# Patient Record
Sex: Female | Born: 1970 | Race: White | Hispanic: No | Marital: Married | Smoking: Never smoker
Health system: Southern US, Community
[De-identification: ages and names within clinical notes are randomized; demographics above are authoritative.]

## PROBLEM LIST (undated history)

## (undated) DIAGNOSIS — N809 Endometriosis, unspecified: Secondary | ICD-10-CM

## (undated) DIAGNOSIS — R519 Headache, unspecified: Secondary | ICD-10-CM

## (undated) DIAGNOSIS — F329 Major depressive disorder, single episode, unspecified: Secondary | ICD-10-CM

## (undated) DIAGNOSIS — R5381 Other malaise: Secondary | ICD-10-CM

## (undated) DIAGNOSIS — I10 Essential (primary) hypertension: Secondary | ICD-10-CM

## (undated) DIAGNOSIS — F411 Generalized anxiety disorder: Secondary | ICD-10-CM

## (undated) DIAGNOSIS — F419 Anxiety disorder, unspecified: Secondary | ICD-10-CM

## (undated) DIAGNOSIS — F32A Depression, unspecified: Secondary | ICD-10-CM

## (undated) DIAGNOSIS — G4700 Insomnia, unspecified: Secondary | ICD-10-CM

## (undated) DIAGNOSIS — F988 Other specified behavioral and emotional disorders with onset usually occurring in childhood and adolescence: Secondary | ICD-10-CM

## (undated) DIAGNOSIS — R5383 Other fatigue: Secondary | ICD-10-CM

## (undated) DIAGNOSIS — R51 Headache: Secondary | ICD-10-CM

## (undated) DIAGNOSIS — E663 Overweight: Secondary | ICD-10-CM

## (undated) HISTORY — PX: OOPHORECTOMY: SHX86

## (undated) HISTORY — PX: DILATION AND CURETTAGE OF UTERUS: SHX78

## (undated) HISTORY — PX: BREAST SURGERY: SHX581

## (undated) HISTORY — PX: PELVIC LAPAROSCOPY: SHX162

## (undated) HISTORY — DX: Overweight: E66.3

## (undated) HISTORY — PX: CHOLECYSTECTOMY: SHX55

## (undated) HISTORY — DX: Headache: R51

## (undated) HISTORY — DX: Other fatigue: R53.83

## (undated) HISTORY — DX: Other fatigue: R53.81

## (undated) HISTORY — PX: ABDOMINAL HYSTERECTOMY: SHX81

## (undated) HISTORY — DX: Other specified behavioral and emotional disorders with onset usually occurring in childhood and adolescence: F98.8

## (undated) HISTORY — DX: Anxiety disorder, unspecified: F41.9

## (undated) HISTORY — DX: Generalized anxiety disorder: F41.1

## (undated) HISTORY — DX: Headache, unspecified: R51.9

## (undated) HISTORY — DX: Insomnia, unspecified: G47.00

---
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
## 1997-09-20 ENCOUNTER — Other Ambulatory Visit: Admission: RE | Admit: 1997-09-20 | Discharge: 1997-09-20 | Payer: Self-pay | Admitting: *Deleted

## 1998-09-14 ENCOUNTER — Other Ambulatory Visit: Admission: RE | Admit: 1998-09-14 | Discharge: 1998-09-14 | Payer: Self-pay | Admitting: *Deleted

## 1999-09-08 ENCOUNTER — Other Ambulatory Visit: Admission: RE | Admit: 1999-09-08 | Discharge: 1999-09-08 | Payer: Self-pay | Admitting: Gynecology

## 2000-02-07 ENCOUNTER — Encounter: Admission: RE | Admit: 2000-02-07 | Discharge: 2000-05-07 | Payer: Self-pay | Admitting: Gynecology

## 2000-04-29 ENCOUNTER — Ambulatory Visit (HOSPITAL_COMMUNITY): Admission: RE | Admit: 2000-04-29 | Discharge: 2000-04-29 | Payer: Self-pay | Admitting: Gastroenterology

## 2000-04-29 ENCOUNTER — Encounter: Payer: Self-pay | Admitting: Gastroenterology

## 2000-06-20 ENCOUNTER — Encounter (INDEPENDENT_AMBULATORY_CARE_PROVIDER_SITE_OTHER): Payer: Self-pay | Admitting: Specialist

## 2000-06-20 ENCOUNTER — Observation Stay (HOSPITAL_COMMUNITY): Admission: RE | Admit: 2000-06-20 | Discharge: 2000-06-21 | Payer: Self-pay | Admitting: General Surgery

## 2000-06-20 ENCOUNTER — Encounter: Payer: Self-pay | Admitting: General Surgery

## 2000-10-17 ENCOUNTER — Other Ambulatory Visit: Admission: RE | Admit: 2000-10-17 | Discharge: 2000-10-17 | Payer: Self-pay | Admitting: Gynecology

## 2001-04-30 ENCOUNTER — Inpatient Hospital Stay (HOSPITAL_COMMUNITY): Admission: AD | Admit: 2001-04-30 | Discharge: 2001-05-03 | Payer: Self-pay | Admitting: Gynecology

## 2001-04-30 ENCOUNTER — Encounter (INDEPENDENT_AMBULATORY_CARE_PROVIDER_SITE_OTHER): Payer: Self-pay | Admitting: *Deleted

## 2001-06-06 ENCOUNTER — Other Ambulatory Visit: Admission: RE | Admit: 2001-06-06 | Discharge: 2001-06-06 | Payer: Self-pay | Admitting: Gynecology

## 2001-12-18 ENCOUNTER — Encounter: Admission: RE | Admit: 2001-12-18 | Discharge: 2002-03-18 | Payer: Self-pay | Admitting: Gynecology

## 2002-06-08 ENCOUNTER — Other Ambulatory Visit: Admission: RE | Admit: 2002-06-08 | Discharge: 2002-06-08 | Payer: Self-pay | Admitting: Gynecology

## 2003-07-13 ENCOUNTER — Other Ambulatory Visit: Admission: RE | Admit: 2003-07-13 | Discharge: 2003-07-13 | Payer: Self-pay | Admitting: Gynecology

## 2003-07-28 ENCOUNTER — Ambulatory Visit (HOSPITAL_COMMUNITY): Admission: RE | Admit: 2003-07-28 | Discharge: 2003-07-28 | Payer: Self-pay | Admitting: Gynecology

## 2003-07-28 ENCOUNTER — Encounter (INDEPENDENT_AMBULATORY_CARE_PROVIDER_SITE_OTHER): Payer: Self-pay | Admitting: Specialist

## 2003-10-07 ENCOUNTER — Other Ambulatory Visit: Admission: RE | Admit: 2003-10-07 | Discharge: 2003-10-07 | Payer: Self-pay | Admitting: Gynecology

## 2004-07-18 ENCOUNTER — Other Ambulatory Visit: Admission: RE | Admit: 2004-07-18 | Discharge: 2004-07-18 | Payer: Self-pay | Admitting: Gynecology

## 2004-11-29 ENCOUNTER — Ambulatory Visit: Payer: Self-pay | Admitting: Family Medicine

## 2007-12-22 ENCOUNTER — Other Ambulatory Visit: Admission: RE | Admit: 2007-12-22 | Discharge: 2007-12-22 | Payer: Self-pay | Admitting: Gynecology

## 2007-12-22 ENCOUNTER — Ambulatory Visit: Payer: Self-pay | Admitting: Gynecology

## 2008-01-05 ENCOUNTER — Encounter: Admission: RE | Admit: 2008-01-05 | Discharge: 2008-01-05 | Payer: Self-pay | Admitting: Gynecology

## 2010-05-19 NOTE — Op Note (Signed)
NAME:  Sally Jackson, Sally Jackson                             ACCOUNT NO.:  [id number]   MEDICAL RECORD NO.:  [id number]                   PATIENT TYPE:  AMB   LOCATION:  SDC                                  FACILITY:  [facility]   PHYSICIAN:  Timothy P. Fontaine, M.D.           DATE OF BIRTH:  1970-05-23   DATE OF PROCEDURE:  07/28/2003  DATE OF DISCHARGE:                                 OPERATIVE REPORT   PREOPERATIVE DIAGNOSIS:  Pelvic pain, rule out endometriosis.   POSTOPERATIVE DIAGNOSIS:  Pelvic pain, rule out endometriosis.   OPERATION PERFORMED:  Laparoscopic biopsy, fulguration of endometriosis.   SURGEON:  Timothy P. Fontaine, M.D.   ANESTHESIA:  General endotracheal.   COMPLICATIONS:  None.   ESTIMATED BLOOD LOSS:  Minimal.   SPECIMENS:  Biopsy peritoneal times two.   FINDINGS:  Anterior cul-de-sac with small midline brown endometriotic  superficial peritoneal lesion, the vesicouterine fold, fulgurated posterior  cul-de-sac, classic powder burn, endometriotic implant midright uterosacral  ligament on the lateral aspect, deep into ligament.  Lateral to this a  superficial powder burn peritoneal implant.  Both areas were biopsied and  bipolar fulgurated.  A white superficial perineal endometriotic implant  medial to the left upper uterosacral ligament, bipolar cauterized.  Right  and left fallopian tubes normal length, caliber, fimbriated ends.  Left  ovary grossly normal, free and mobile.  Right ovary with a small brown  superficial cortex lesion consistent with endometriosis.  Bipolar  cauterized.  Ovary otherwise normal, free and mobile.  Uterus normal, shape,  size and contour.  A bluish dome cystic area, upper anterior serosal area  questionable adenomyotic cyst versus postsurgical cystic change.  The area  was bipolar cauterized.  Upper abdominal exam was grossly normal.  No  evidence of adhesions.  Liver was smooth.  No abnormalities.  The appendix  was visualized, free and  mobile.   DESCRIPTION OF PROCEDURE:  The patient was taken to the operating room,  underwent general endotracheal anesthesia, was placed in low dorsal  lithotomy position, received an abdominal preparation with Betadine scrub,  subsequent preparation with Betadine solution.  She received a perineal  vaginal preparation with Betadine solution.  Bladder emptied with in and out  Foley catheterization.  Examination under anesthesia performed and a Hulka  tenaculum was placed through the cervix for manipulation.  The patient was  draped in the usual fashion.  A vertical infraumbilical incision was made,  the Verres needle placed, its position verified with water and approximately  3 L of carbon dioxide gas was infused without difficulty.  The 10 mm  laparoscopic trocar was then placed without difficulty.  Its position  verified visually.  A 5 mm suprapubic port was then placed under direct  visualization after transillumination for the vessels in the prior cesarean  scar without difficulty.  Examination of the pelvic organs and upper  abdominal exam was carried out with  the findings noted above.  The Hulka  tenaculum was manipulated to assure the bluish cystic anterior change was  not as a result of the Hulka tenaculum and it was not.  The right  uterosacral lesion was then elevated with a grasper and a biopsy was taken.  The right peritoneal lesion was elevated with a grasper to tent the lesion  away from the underlying structures and a representative biopsy again was  taken.  Subsequently bipolar cautery was delivered to both of these areas.  Again tenting the peritoneum to avoid underlying structural damage.  The  left posterior cul-de-sac, right endometriotic implant was then tented with  the bipolar grasper and bipolar cauterized.  A similar procedure was carried  out on the anterior vesicouterine fold endometriotic implant.  Lastly, the  uterine cystic change was manipulated with the  grasper and underlying  nodularity was noted consistent with a small fibroid versus adenomyotic  nodule.  This area was bipolar cauterized.  Lastly, the right ovarian  superficial cortex endometriotic area was bipolar cauterized. The pelvis was  then copiously irrigated.  Adequate hemostasis was visualized.  No other  areas of endometriosis were appreciated.  The suprapubic port was removed,  the gas allowed to escape, all areas inspected under a low pressure  situation showing adequate hemostasis and the infraumbilical port was then  backed out under direct visualization showing adequate hemostasis and no  evidence of hernia formation.  The 0 Vicryl subcutaneous fascial stitch was  placed infraumbilically.  Both skin incision sites were injected using 0.25%  Marcaine and both skin sites were closed using Dermabond skin adhesive.  The  Hulka tenaculum was removed.  The patient was placed in supine position and  awakened without difficulty, taken to recovery room in good condition having  tolerated the procedure well.                                               Timothy P. Audie Box, M.D.    TPF/MEDQ  D:  07/28/2003  T:  07/28/2003  Job:  [id number]

## 2010-05-19 NOTE — Op Note (Signed)
[facility]  Patient:    SIHAAM, CHROBAK Visit Number: [id number] MRN: [id number]          Service Type: OBS Location: 910A 9116 01 Attending Physician:  Merrily Pew Dictated by:   Nadyne Coombes Fontaine, M.D. Proc. Date: 04/30/01 Admit Date:  04/30/2001                             Operative Report  PREOPERATIVE DIAGNOSES:       1. Pregnancy at 38 weeks.                               2. Breech presentation.                               3. Marginal amniotic fluid index.  POSTOPERATIVE DIAGNOSES:      1. Pregnancy at 38 weeks.                               2. Breech presentation.                               3. Marginal amniotic fluid index.  PROCEDURE:                    Primary low transverse cervical cesarean section.  SURGEON:                      Timothy P. Fontaine, M.D.  ASSISTANT:                    Katy Fitch, M.D.  ANESTHESIA:                   Spinal.  ESTIMATED BLOOD LOSS:         Less than 500 cc.  COMPLICATIONS:                None.  SPECIMENS:                    Samples of cord blood and placenta.  FINDINGS:                     At 59, a normal female with Apgars of 9 and 9. Weight 7 pounds 7 ounces.  Pelvic anatomy noted to be normal.  DESCRIPTION OF PROCEDURE:     The patient was taken to the operating room and underwent spinal anesthesia.  She was placed in the left tilt supine position and received an abdominal preparation with Betadine scrub and Betadine solution.  A Foley catheter was placed with sterile technique.   The patient was draped in the usual fashion.  After assuring adequate anesthesia, the abdomen was sharply entered through a Pfannenstiel incision, achieving adequate hemostasis at all levels.  A bladder flap was then sharply and bluntly developed without difficulty.  The uterus was sharply entered in the lower uterine segment and bluntly extended laterally.  The membranes were ruptured and  the fluid noted to be clear.  The infant was found to be in the frank breech presentation, was converted to a footling breech and underwent a breech extraction.  The nares and mouth  were suctioned.  The cord was double clamped and cut and the infant was handed to pediatrics in attendance. Samples of cord blood were obtained.  The placenta was spontaneously extruded, noted to be intact and was sent to pathology.  The patient received 1 g of Ancef IV antibiotic prophylaxis.  The uterus was exteriorized.  The endometrial cavity was explored with a sponge to remove all placental membrane fragments.  The uterine incision was closed in one layer using 0 Vicryl suture with a running interlocking stitch.  Adequate hemostasis was achieved with each layer.  The uterus was returned to the abdomen.  The abdomen was copiously irrigated.  Adequate hemostasis was visualized.  The anterior fascia was then closed in a running stitch using 0 Vicryl suture.  The subcutaneous tissues were irrigated.  Adequate hemostasis was achieved with electrocautery. The skin was reapproximated with staples.  A sterile dressing was applied. The patient was taken to the recovery room in good condition, having tolerated the procedure well. Dictated by:   Nadyne Coombes. Fontaine, M.D. Attending Physician:  Merrily Pew DD:  04/30/01 TD:  04/30/01 Job: [id number]

## 2010-05-19 NOTE — Discharge Summary (Signed)
[facility]  Patient:    Sally Jackson, Sally Jackson Visit Number: [id number] MRN: [id number]          Service Type: OBS Location: 910A 9116 01 Attending Physician:  Merrily Pew Dictated by:   Antony Contras, [facility] Admit Date:  04/30/2001 Discharge Date: 05/03/2001                             Discharge Summary  DISCHARGE DIAGNOSES:          1. Intrauterine pregnancy at 38 weeks.                               2. Breech presentation.                               3. Marginal amniotic fluid.  PROCEDURES:                   Primary low cervical transverse cesarean section with delivery of a viable infant.  HISTORY OF PRESENT ILLNESS:   The patient is a 40 year old gravida 3, para 1-0-1-1 with an LMP of July 03, 2000, [facility] May 10, 2001.  Prenatal course was complicated by oligohydramnios, breech presentation.  The patients first child also has aortic stenosis.  LABORATORY DATA:              Blood type A+, antibody screen negative.  RPR, HBsAg, HIV nonreactive.  HOSPITAL COURSE AND TREATMENT:                    The patient was admitted at 38 weeks for a cesarean section secondary to breech presentation, marginal amniotic fluid index.  The procedure was performed by Dr. Audie Box, assisted by Dr. Penni Homans under spinal anesthesia.  Findings included a normal female infant, Apgars 9 and 9, weight 7 pounds and 7 ounces, normal pelvic anatomy.  Postpartum course, remained afebrile, had no difficulty voiding, was able to be discharged on her third postpartum day in satisfactory condition.  CBC:  Hematocrit 32, hemoglobin 11, WBC 10.2, platelets 171.  DISPOSITION:                  Follow up in six weeks.  Continue with prenatal vitamins and iron, Motrin and Tylox for pain. Dictated by:   Antony Contras, [facility] Attending Physician:  Merrily Pew DD:  05/22/01 TD:  05/25/01 Job: [id number]

## 2010-05-19 NOTE — H&P (Signed)
NAME:  Sally Jackson, Sally Jackson                             ACCOUNT NO.:  [id number]   MEDICAL RECORD NO.:  [id number]                   PATIENT TYPE:  AMB   LOCATION:  SDC                                  FACILITY:  [facility]   PHYSICIAN:  Timothy P. Fontaine, M.D.           DATE OF BIRTH:  1970/06/05   DATE OF ADMISSION:  07/28/2003  DATE OF DISCHARGE:                                HISTORY & PHYSICAL   CHIEF COMPLAINT:  Right-sided pain.   HISTORY OF PRESENT ILLNESS:  A 40 year old, G3, P2, AB1, female with a  several month history of right-sided pain.  The patient notes that the pain  is on a daily basis, sharp, stabbing, to cramping.  It is reproducible with  intercourse on this side with ever episode.  She has no bowel or urinary  symptoms.  Outpatient ultrasound is normal.  She is admitted at this time  for laparoscopy for her pelvic pain.   PAST MEDICAL HISTORY:  Heart palpitations.   PAST SURGICAL HISTORY:  1. Vaginal delivery x1.  2. Cesarean section x1.  3. Cholecystectomy.  4. D&C.   CURRENT MEDICATIONS:  1. __________ 20 mg daily.  2. Toprol-XL 50 mg daily.  3. Multivitamins.  4. Prevacid 30 mg daily.   ALLERGIES:  No medications.   REVIEW OF SYSTEMS:  Noncontributory.   FAMILY HISTORY:  Noncontributory.   SOCIAL HISTORY:  Noncontributory.   PHYSICAL EXAMINATION:  HEENT:  Normal.  LUNGS:  Clear.  CARDIAC:  Regular rate without rubs, murmurs, or gallops.  ABDOMEN:  Benign.  PELVIC:  External BUS, vagina normal.  Cervix normal.  Uterus normal size,  shape, contour, midline, mobile.  Adnexa without masses or tenderness.   ASSESSMENT:  A 40 year old, G3, P2, AB1 female with persistent right-sided  pain, worsening, reproducible with intercourse.  No palpable or  ultrasonographic abnormalities.  The patient is admitted at this time for  laser video laparoscopy.  I reviewed with the patient what is involved with  the procedure, the expected intraoperative, postoperative  courses.  She  understands there are no guarantees as far as pain relief.  Her pain may  persist, worsen, or change following the procedure.  She may also have a  negative laparoscopy, all of which she understands.  The risks of  inadvertent injury to internal organs including bowel, bladder, ureters,  vessels, and nerves necessitating major exploratory reparative surgeries and  future reparative surgeries including ostomy formation was all discussed,  understood, and accepted.  The risks of transfusion if hemorrhage occurs was  discussed with her, as well as the risk of infection, both internal  requiring prolonged antibiotics, as well as incision requiring opening and  draining of incisions was all discussed with her.  The patient's questions  were answered to her satisfaction, and she is ready to proceed with surgery.  Timothy P. Audie Box, M.D.    TPF/MEDQ  D:  07/27/2003  T:  07/27/2003  Job:  [id number]

## 2010-05-19 NOTE — H&P (Signed)
[facility]  Patient:    Sally Jackson, Sally Jackson Visit Number: [id number] MRN: [id number]          Service Type: Attending:  Nadyne Coombes. Fontaine, M.D. Dictated by:   Nadyne Coombes. Fontaine, M.D. Adm. Date:  04/24/01                           History and Physical  CHIEF COMPLAINT:              1. Pregnancy at 37+ weeks.                               2. Persistent breech presentation.                               3. Marginal alpha fetoprotein.  HISTORY OF PRESENT ILLNESS:   This 40 year old G3, P1, AB1 female at [redacted] weeks gestation has been followed for persistent breech presentation and marginal AFI measurements.  The patient has been having preterm contractions which have resulted in cervical dilatation to 1-2 cm dilated, 50% effaced and -2 station, and has been treated with at home bed rest.  The patient was noted on antepartum evaluation to have an AFI in the 10th percentile and has been followed with home bed rest, with most recent measurement in the 15th percentile.  The patients placenta is also noted to be anterior on ultrasonographic evaluation.  The options for expectant management with attempted version version versus vaginal breech delivery versus primary cesarean section were reviewed with her, and she elects for cesarean section. Due to the cervical change and the frequent episodes of contractions as well as her marginal AFI, the patient has been scheduled earlier than an elective cesarean section at 37-38 weeks.  The patient understands the issues of prematurity associated with delivery.  PAST MEDICAL HISTORY:         Uncomplicated.  PAST SURGICAL HISTORY:        1. D&C.                               2. Cholecystectomy.                               3. Wisdom teeth.                               4. Breast cyst removal.  CURRENT MEDICATIONS:          Prenatal vitamins.  ALLERGIES:                    None.  REVIEW OF SYSTEMS:             Noncontributory.  FAMILY HISTORY:               Noncontributory.  SOCIAL HISTORY:               Noncontributory.  ADMISSION PHYSICAL EXAMINATION:  VITAL SIGNS:                  Afebrile.  Vital signs are stable.  HEENT:  Normal.  LUNGS:                        Clear.  CARDIAC:                      Regular rate.  No murmurs, rubs, or gallops.  ABDOMEN:                      Breech presentation.  PELVIC:                       Deferred.  Positive fetal heart tones.  ASSESSMENT:                   32. A 40 year old G3, P1, AB1 female at 37-[redacted]                                  weeks gestation.                               2. Persistent breech presentation.                               3. Anterior placenta.                               4. Marginal alpha fetoprotein measurements.  PLAN:                         Primary cesarean section.  The risks, benefits, indications and alternatives were reviewed with the patient to include attempts at version versus vaginal breech delivery and she elects for primary cesarean section.  I reviewed the risks associated with this to include bleeding; transfusion; infection; wound complications requiring opening and draining of incisions; closure by secondary intention; damage to internal organs including bowel, bladder, ureters, vessels and nerves necessitating major exploratory surgeries and future reparative surgeries including ostomy formation.  Fetal injury during the process was also reviewed, understood and accepted.  The patients questions were answered to her satisfaction and she is ready to proceed with surgery. Dictated by:   Nadyne Coombes. Fontaine, M.D. Attending:  Nadyne Coombes. Fontaine, M.D. DD:  04/21/01 TD:  04/21/01 Job: [id number]

## 2012-08-12 ENCOUNTER — Other Ambulatory Visit: Payer: Self-pay

## 2012-08-12 DIAGNOSIS — Z1231 Encounter for screening mammogram for malignant neoplasm of breast: Secondary | ICD-10-CM

## 2012-09-15 ENCOUNTER — Ambulatory Visit: Payer: Self-pay

## 2012-10-20 ENCOUNTER — Ambulatory Visit: Payer: Self-pay

## 2012-12-22 ENCOUNTER — Ambulatory Visit
Admission: RE | Admit: 2012-12-22 | Discharge: 2012-12-22 | Disposition: A | Discharge status: Home / Self Care | Payer: BC Managed Care – PPO | Source: Ambulatory Visit

## 2012-12-22 DIAGNOSIS — Z1231 Encounter for screening mammogram for malignant neoplasm of breast: Secondary | ICD-10-CM

## 2016-05-24 ENCOUNTER — Other Ambulatory Visit: Payer: Self-pay

## 2016-05-24 ENCOUNTER — Emergency Department (HOSPITAL_COMMUNITY): Payer: Commercial Managed Care - PPO

## 2016-05-24 ENCOUNTER — Encounter (HOSPITAL_COMMUNITY): Payer: Self-pay | Admitting: Emergency Medicine

## 2016-05-24 ENCOUNTER — Emergency Department (HOSPITAL_COMMUNITY)
Admission: EM | Admit: 2016-05-24 | Discharge: 2016-05-25 | Disposition: A | Discharge status: Home / Self Care | Payer: Commercial Managed Care - PPO | Attending: Emergency Medicine | Admitting: Emergency Medicine

## 2016-05-24 DIAGNOSIS — R0602 Shortness of breath: Secondary | ICD-10-CM | POA: Diagnosis not present

## 2016-05-24 DIAGNOSIS — I10 Essential (primary) hypertension: Secondary | ICD-10-CM | POA: Insufficient documentation

## 2016-05-24 DIAGNOSIS — R079 Chest pain, unspecified: Secondary | ICD-10-CM

## 2016-05-24 DIAGNOSIS — R0789 Other chest pain: Secondary | ICD-10-CM | POA: Diagnosis not present

## 2016-05-24 DIAGNOSIS — E876 Hypokalemia: Secondary | ICD-10-CM | POA: Diagnosis not present

## 2016-05-24 HISTORY — DX: Essential (primary) hypertension: I10

## 2016-05-24 HISTORY — DX: Major depressive disorder, single episode, unspecified: F32.9

## 2016-05-24 HISTORY — DX: Depression, unspecified: F32.A

## 2016-05-24 HISTORY — DX: Endometriosis, unspecified: N80.9

## 2016-05-24 LAB — BASIC METABOLIC PANEL
ANION GAP: 8 (ref 5–15)
BUN: 9 mg/dL (ref 6–20)
CO2: 27 mmol/L (ref 22–32)
Calcium: 9.2 mg/dL (ref 8.9–10.3)
Chloride: 104 mmol/L (ref 101–111)
Creatinine, Ser: 0.74 mg/dL (ref 0.44–1.00)
Glucose, Bld: 106 mg/dL — ABNORMAL HIGH (ref 65–99)
Potassium: 2.9 mmol/L — ABNORMAL LOW (ref 3.5–5.1)
Sodium: 139 mmol/L (ref 135–145)

## 2016-05-24 LAB — I-STAT TROPONIN, ED: TROPONIN I, POC: 0 ng/mL (ref 0.00–0.08)

## 2016-05-24 LAB — CBC
HCT: 38 % (ref 36.0–46.0)
HEMOGLOBIN: 12.3 g/dL (ref 12.0–15.0)
MCH: 28.7 pg (ref 26.0–34.0)
MCHC: 32.4 g/dL (ref 30.0–36.0)
MCV: 88.8 fL (ref 78.0–100.0)
Platelets: 275 10*3/uL (ref 150–400)
RBC: 4.28 MIL/uL (ref 3.87–5.11)
RDW: 12.7 % (ref 11.5–15.5)
WBC: 9 10*3/uL (ref 4.0–10.5)

## 2016-05-24 LAB — D-DIMER, QUANTITATIVE (NOT AT ARMC): D DIMER QUANT: 0.42 ug{FEU}/mL (ref 0.00–0.50)

## 2016-05-24 MED ORDER — POTASSIUM CHLORIDE CRYS ER 20 MEQ PO TBCR
60.0000 meq | EXTENDED_RELEASE_TABLET | Freq: Once | ORAL | Status: AC
  Administered 2016-05-25: 60 meq via ORAL
  Filled 2016-05-24: qty 3

## 2016-05-24 MED ORDER — POTASSIUM CHLORIDE CRYS ER 20 MEQ PO TBCR: 40.0000 meq | EXTENDED_RELEASE_TABLET | Freq: Once | ORAL | Status: DC

## 2016-05-24 NOTE — ED Provider Notes (Signed)
[facility]-EMERGENCY DEPT Provider Note   CSN: [id number] Arrival date & time: 05/24/16  2108     History   Chief Complaint Chief Complaint  Patient presents with  . Chest Pain    HPI Sally Jackson is a 46 y.o. female.  46 year old female with a history of hypertension and depression presents to the emergency department for evaluation of chest pain. Chest pain began at approximately 1900 this evening while patient was seated at home. Symptoms associated with a "heaviness and tingling" in her left upper extremity. She also reports noting shortness of breath. Patient describes her chest pain as a tightness. This is nonradiating. It is not aggravated by exertion. Pain currently rated 5/10, down from 8/10 after SL NTG x 3. Patient's father also administered 324mg  ASA PTA. Patient denies a history of similar symptoms. She does report having steadily worsening hypertension over the past 3 weeks since an abdominal hysterectomy. Patient denies a personal history of smoking, high cholesterol, and diabetes. No known family history of heart attack. No personal or FHx of DVT/PE. No associated fever, vomiting, abdominal pain, extremity numbness, or syncope.      Past Medical History:  Diagnosis Date  . Depression   . Endometriosis   . Hypertension     There are no active problems to display for this patient.   Past Surgical History:  Procedure Laterality Date  . ABDOMINAL HYSTERECTOMY      OB History    No data available       Home Medications    Prior to Admission medications   Not on File    Family History No family history on file.  Social History Social History  Substance Use Topics  . Smoking status: Never Smoker  . Smokeless tobacco: Never Used  . Alcohol use No     Allergies   Patient has no known allergies.   Review of Systems Review of Systems Ten systems reviewed and are negative for acute change, except as noted in the HPI.    Physical Exam Updated Vital  Signs BP (!) 141/78   Pulse 97   Temp 98.4 F (36.9 C) (Oral)   Resp 15   SpO2 98%   Physical Exam  Constitutional: She is oriented to person, place, and time. She appears well-developed and well-nourished. No distress.  Nontoxic appearing and in NAD  HENT:  Head: Normocephalic and atraumatic.  Eyes: Conjunctivae and EOM are normal. No scleral icterus.  Neck: Normal range of motion.  Pulmonary/Chest: Effort normal. No respiratory distress.  Respirations even and unlabored  Musculoskeletal: Normal range of motion.  Neurological: She is alert and oriented to person, place, and time. She exhibits normal muscle tone. Coordination normal.  GCS 15. Patient moving all extremities.  Skin: Skin is warm and dry. No rash noted. She is not diaphoretic. No erythema. No pallor.  Psychiatric: She has a normal mood and affect. Her behavior is normal.  Nursing note and vitals reviewed.    ED Treatments / Results  Labs (all labs ordered are listed, but only abnormal results are displayed) Labs Reviewed  BASIC METABOLIC PANEL - Abnormal; Notable for the following:       Result Value   Potassium 2.9 (*)    Glucose, Bld 106 (*)    All other components within normal limits  CBC  D-DIMER, QUANTITATIVE (NOT AT [facility])  Rosezena Sensor, ED    ED ECG REPORT   Date: 05/24/2016  Rate: 99  Rhythm: normal sinus rhythm  QRS Axis: normal  Intervals: QT prolonged (QTc 462)  ST/T Wave abnormalities: nonspecific T wave changes  Conduction Disutrbances:none  Narrative Interpretation: NSR with nonspecific T wave changes.  Old EKG Reviewed: none available  I have personally reviewed the EKG tracing and agree with the computerized printout as noted.   Radiology Dg Chest 2 View  Result Date: 05/24/2016 CLINICAL DATA:  Shortness of breath and mid chest pain radiating down the left arm tonight. History of high blood pressure. Hysterectomy 9 weeks ago. EXAM: CHEST  2 VIEW COMPARISON:  12/12/2006  FINDINGS: Normal heart size and pulmonary vascularity. No focal airspace disease or consolidation in the lungs. No blunting of costophrenic angles. No pneumothorax. Mediastinal contours appear intact. Degenerative changes in the spine. Surgical clips in the right upper quadrant. IMPRESSION: No active cardiopulmonary disease. Electronically Signed   By: Burman NievesWilliam  Stevens M.D.   On: 05/24/2016 22:14    Procedures Procedures (including critical care time)  Medications Ordered in ED Medications - No data to display   Initial Impression / Assessment and Plan / ED Course  I have reviewed the triage vital signs and the nursing notes.  Pertinent labs & imaging results that were available during my care of the patient were reviewed by me and considered in my medical decision making (see chart for details).     46 year old female presents to the emergency department for evaluation of chest pain. Chest pain in setting of mild hypertension which is reportedly new for the patient. Symptoms moderately suspicious for cardiac etiology; however, workup today has been reassuring with negative troponin 2 and nonischemic EKG. Heart score 2 consistent with low risk of acute coronary event. D-dimer also obtained given history of recent surgery. This is negative. Patient with low pretest probability for PE. Doubt pulmonary embolus at this time.  Patient has had gradual improvement in her symptoms since arrival. She does report some improvement with a GI cocktail. Question whether symptoms may be secondary to gastritis, the patient denies a known history of reflux. Given the patient's reassuring workup today, I do not believe further emergent testing or admission is indicated. The patient will be referred to outpatient cardiology for follow-up and completion of an outpatient stress test. Primary care follow-up also advised. Return precautions given. Patient discharged in stable condition with no unaddressed  concerns.   Final Clinical Impressions(s) / ED Diagnoses   Final diagnoses:  Nonspecific chest pain  Hypokalemia    New Prescriptions There are no discharge medications for this patient.    Antony MaduraHumes, Lossie Kalp, PA-C 05/26/16 2143    Gerhard MunchLockwood, Robert, MD 05/30/16 [phone]

## 2016-05-24 NOTE — ED Triage Notes (Signed)
Patient arrived with EMS from home reports central chest pain/tightness radiating to left arm onset this evening  With mild SOB , no emesis or diaphoresis , she took ASA 324 mg prior to arrival / EMS gave 3 NTG sl with slight relief.

## 2016-05-25 DIAGNOSIS — R0789 Other chest pain: Secondary | ICD-10-CM | POA: Diagnosis not present

## 2016-05-25 DIAGNOSIS — I10 Essential (primary) hypertension: Secondary | ICD-10-CM | POA: Diagnosis not present

## 2016-05-25 DIAGNOSIS — E876 Hypokalemia: Secondary | ICD-10-CM | POA: Diagnosis not present

## 2016-05-25 LAB — I-STAT TROPONIN, ED: Troponin i, poc: 0 ng/mL (ref 0.00–0.08)

## 2016-05-25 MED ORDER — GI COCKTAIL ~~LOC~~
30.0000 mL | Freq: Once | ORAL | Status: AC
  Administered 2016-05-25: 30 mL via ORAL
  Filled 2016-05-25: qty 30

## 2016-05-25 NOTE — ED Notes (Signed)
Pt stable, ambulatory, states understanding of discharge instructions 

## 2016-05-25 NOTE — Discharge Instructions (Signed)
Your work up today was reassuring and did not show evidence concerning for heart attack, lung process, or blood clot. Your symptoms may be partly due to stomach irritation/gastritis. We recommend the use of over-the-counter Zantac or Prilosec daily for this. We also advise that you see a cardiologist regarding your visit today. Have your blood pressure and potassium rechecked by your primary care doctor. You may need to be started on blood pressure medication. You may return to the emergency department, as needed, for new or concerning symptoms.

## 2016-05-30 NOTE — Progress Notes (Addendum)
Cardiology Office Note NEW PATIENT VISIT   Date:  05/31/2016   ID:  Sally Jackson, DOB 11-Feb-1970, MRN [id number]  PCP:  Hurshel Party, NP  Cardiologist:  New Dr. Eden Emms    Chief Complaint  Patient presents with  . Chest Pain      History of Present Illness: Sally Jackson is a 46 y.o. female who presents for ER visit for chest pain, HTN and tachycardia .  She has hx of HTN and depression    CXR with no active cardiopulmonary disease.  K+ was 2.9 which was replaced. Troponin poc neg X 2.  ddimer neg.  EKG per note HR 99 SR, non specific T wave changes. No old to compare.  Today she reports TAH and BSO 10 weeks ago today.  Post op visits with elevated BP 164/94 with rechecks of 148/90.  This would happen on each follow up.  Prior to surgery her BP was 130-140/90.  Then on the 25th her BP was at 171/103 she did not feel well, with chest pressure, heart racing,  she took 4 baby ASA and EMS called 3 NTG by EMS with some improvement.  Though she developed a massive head ache. Labs as above. She has since been placed on HCTZ and today BP is improved.  But still up at times.  Also heart racing at times.  No further chest pain. No associated symptoms with it except as described.   Her thyroid in Jan was normal.   Past Medical History:  Diagnosis Date  . ADD (attention deficit disorder)   . Anxiety   . Depression   . Endometriosis   . Head ache   . Hypertension   . Insomnia   . Malaise and fatigue   . Overanxious disorder   . Overweight     Past Surgical History:  Procedure Laterality Date  . ABDOMINAL HYSTERECTOMY    . BREAST SURGERY    . CESAREAN SECTION    . CHOLECYSTECTOMY    . DILATION AND CURETTAGE OF UTERUS    . OOPHORECTOMY    . PELVIC LAPAROSCOPY       Current Outpatient Prescriptions  Medication Sig Dispense Refill  . ALPRAZolam (XANAX) 0.25 MG tablet Take 0.25 mg by mouth every 8 (eight) hours as needed for anxiety.    Marland Kitchen dexmethylphenidate (FOCALIN XR) 20 MG 24  hr capsule Take 20 mg by mouth daily.    Marland Kitchen loratadine (CLARITIN) 10 MG tablet Take 10 mg by mouth daily as needed for allergies.    Marland Kitchen MELATONIN PO Take by mouth at bedtime. Pt unsure of dosage    . Multiple Vitamins-Minerals (ONE DAILY COMPLETE PO) Take by mouth daily.    Marland Kitchen vortioxetine HBr (TRINTELLIX) 10 MG TABS Take 10 mg by mouth daily.    Marland Kitchen losartan (COZAAR) 25 MG tablet Take 1 tablet (25 mg total) by mouth daily. 90 tablet 3  . metoprolol succinate (TOPROL XL) 25 MG 24 hr tablet Take 1 tablet (25 mg total) by mouth daily. 90 tablet 3   No current facility-administered medications for this visit.     Allergies:   Patient has no known allergies.    Social History:  The patient  reports that she has never smoked. She has never used smokeless tobacco. She reports that she does not drink alcohol or use drugs.   Family History:  The patient's family history includes Aortic stenosis in her son; Arrhythmia in her father; Cancer - Prostate in  her father; Glaucoma in her maternal grandmother and mother; Healthy in her sister; Hyperlipidemia in her father and mother; Hypertension in her father and mother; Thyroid disease in her mother.   Son's AS from birth   ROS:  General:no colds or fevers, no weight changes Skin:no rashes or ulcers HEENT:no blurred vision, no congestion CV:see HPI PUL:see HPI GI:no diarrhea constipation or melena, no indigestion GU:no hematuria, no dysuria MS:no joint pain, no claudication Neuro:no syncope, no lightheadedness Endo:no diabetes, no thyroid disease  Wt Readings from Last 3 Encounters:  05/31/16 148 lb 1.9 oz (67.2 kg)     PHYSICAL EXAM: VS:  BP 138/80   Pulse 91   Ht 5\' 2"  (1.575 m)   Wt 148 lb 1.9 oz (67.2 kg)   SpO2 98%   BMI 27.09 kg/m  , BMI Body mass index is 27.09 kg/m. General:Pleasant affect, NAD Skin:Warm and dry, brisk capillary refill HEENT:normocephalic, sclera clear, mucus membranes moist Neck:supple, no JVD, no bruits    Heart:S1S2 RRR without murmur, gallup, rub or click Lungs:clear without rales, rhonchi, or wheezes WGN:FAOZAbd:soft, non tender, + BS, do not palpate liver spleen or masses Ext:no lower ext edema, 2+ pedal pulses, 2+ radial pulses Neuro:alert and oriented, MAE, follows commands, + facial symmetry    EKG:  EKG is not ordered today. The ekg from ER reviewed.    Recent Labs: 05/24/2016: BUN 9; Creatinine, Ser 0.74; Hemoglobin 12.3; Platelets 275; Potassium 2.9; Sodium 139    Lipid Panel No results found for: CHOL, TRIG, HDL, CHOLHDL, VLDL, LDLCALC, LDLDIRECT     Other studies Reviewed: Additional studies/ records that were reviewed today include: ER visit notes and GYN notes..   ASSESSMENT AND PLAN:  1.  Chest pain with HTN will check ETT to check for ischemia and HR and BP  2.  Uncontrolled HTN improved today on HCTZ but with hypokalemia will stop and add toprol XL 25 mg and cozaar 25 mg daily.  She will hold BB for POET.  Will also check renal arterial duplex.  With labile BP.    3.  Inappropriate tachycardia, continues despite improved BP control.  Will check Echo and add BB.  4. recent TH and SBO.   5. Hypokalemia has been replaced will recheck today.    Discussed with Dr. Eden EmmsNishan - she will follow up after tests with Dr. Eden EmmsNishan or with me on day he is in the office.  Pt's father also a pt here was with her today.     Current medicines are reviewed with the patient today.  The patient Has no concerns regarding medicines.  The following changes have been made:  See above Labs/ tests ordered today include:see above  Disposition:   FU:  see above  Signed, Nada BoozerLaura Fable Huisman, NP  05/31/2016 10:58 AM    [facility] [address], [place], [place]  [address]/ [address] [place], [place] Phone: [phone]; Fax: [phone]  [phone]

## 2016-05-31 ENCOUNTER — Encounter: Payer: Self-pay | Admitting: Cardiology

## 2016-05-31 ENCOUNTER — Ambulatory Visit (INDEPENDENT_AMBULATORY_CARE_PROVIDER_SITE_OTHER): Payer: Commercial Managed Care - PPO | Admitting: Cardiology

## 2016-05-31 VITALS — BP 138/80 | HR 91 | Ht 62.0 in | Wt 148.1 lb

## 2016-05-31 DIAGNOSIS — E876 Hypokalemia: Secondary | ICD-10-CM | POA: Diagnosis not present

## 2016-05-31 DIAGNOSIS — I10 Essential (primary) hypertension: Secondary | ICD-10-CM | POA: Diagnosis not present

## 2016-05-31 DIAGNOSIS — R000 Tachycardia, unspecified: Secondary | ICD-10-CM | POA: Diagnosis not present

## 2016-05-31 DIAGNOSIS — R0789 Other chest pain: Secondary | ICD-10-CM

## 2016-05-31 LAB — BASIC METABOLIC PANEL
BUN / CREAT RATIO: 18 (ref 9–23)
BUN: 12 mg/dL (ref 6–24)
CO2: 29 mmol/L (ref 18–29)
CREATININE: 0.65 mg/dL (ref 0.57–1.00)
Calcium: 10.4 mg/dL — ABNORMAL HIGH (ref 8.7–10.2)
Chloride: 96 mmol/L (ref 96–106)
GFR calc Af Amer: 125 mL/min/{1.73_m2} (ref >59)
GFR calc non Af Amer: 108 mL/min/{1.73_m2} (ref >59)
Glucose: 90 mg/dL (ref 65–99)
Potassium: 3.3 mmol/L — ABNORMAL LOW (ref 3.5–5.2)
SODIUM: 142 mmol/L (ref 134–144)

## 2016-05-31 LAB — MAGNESIUM: Magnesium: 2 mg/dL (ref 1.6–2.3)

## 2016-05-31 MED ORDER — METOPROLOL SUCCINATE ER 25 MG PO TB24: 25.0000 mg | ORAL_TABLET | Freq: Every day | ORAL | 3 refills | Status: DC

## 2016-05-31 MED ORDER — LOSARTAN POTASSIUM 25 MG PO TABS: 25.0000 mg | ORAL_TABLET | Freq: Every day | ORAL | 3 refills | Status: AC

## 2016-05-31 NOTE — Patient Instructions (Addendum)
Medication Instructions:  STOP HCTZ  START losartan (Cozaar) 25 mg daily. START metoprolol succinate (Toprol) 25mg  daily  Labwork: Please have lab work TODAY. (BMET, Mag)  Testing/Procedures: Your physician has requested that you have an exercise tolerance test. For further information please visit [email or web address]. Please also follow instruction sheet, as given.  Your physician has requested that you have an echocardiogram. Echocardiography is a painless test that uses sound waves to create images of your heart. It provides your doctor with information about the size and shape of your heart and how well your heart's chambers and valves are working. This procedure takes approximately one hour. There are no restrictions for this procedure.  Follow-Up: Your physician recommends that you schedule a follow-up appointment in: 2-3 weeks with Dr. Eden EmmsNishan (if no availabilities, schedule with Nada BoozerLaura Ingold NP on a day Dr. Eden EmmsNishan is in office.)   Any Other Special Instructions Will Be Listed Below (If Applicable).     If you need a refill on your cardiac medications before your next appointment, please call your pharmacy.

## 2016-06-01 ENCOUNTER — Ambulatory Visit: Payer: Commercial Managed Care - PPO | Admitting: Nurse Practitioner

## 2016-06-01 ENCOUNTER — Telehealth: Payer: Self-pay | Admitting: *Deleted

## 2016-06-01 DIAGNOSIS — E876 Hypokalemia: Secondary | ICD-10-CM

## 2016-06-01 MED ORDER — POTASSIUM CHLORIDE ER 10 MEQ PO TBCR: EXTENDED_RELEASE_TABLET | ORAL | 0 refills | Status: DC

## 2016-06-01 NOTE — Telephone Encounter (Signed)
-----   Message from Leone BrandLaura R Ingold, NP sent at 05/31/2016  5:08 PM EDT ----- K+ low at 3.3 take kdur 10 meq tabs 2 twice a day for one day then lets repeat BMP in a week, off HCTZ it should normalize.

## 2016-06-01 NOTE — Addendum Note (Signed)
Addended by: Burnetta SabinWITTY, Edda Orea K on: 06/01/2016 07:14 AM   Modules accepted: Orders

## 2016-06-06 ENCOUNTER — Other Ambulatory Visit: Payer: Commercial Managed Care - PPO | Admitting: *Deleted

## 2016-06-06 ENCOUNTER — Ambulatory Visit (INDEPENDENT_AMBULATORY_CARE_PROVIDER_SITE_OTHER): Payer: Commercial Managed Care - PPO

## 2016-06-06 DIAGNOSIS — R0789 Other chest pain: Secondary | ICD-10-CM | POA: Diagnosis not present

## 2016-06-06 DIAGNOSIS — E876 Hypokalemia: Secondary | ICD-10-CM | POA: Diagnosis not present

## 2016-06-06 LAB — EXERCISE TOLERANCE TEST
CSEPED: 9 min
CSEPEW: 10.1 METS
CSEPHR: 92 %
Exercise duration (sec): 0 s
MPHR: 176 {beats}/min
Peak HR: 162 {beats}/min
RPE: 16
Rest HR: 105 {beats}/min

## 2016-06-07 LAB — BASIC METABOLIC PANEL
BUN / CREAT RATIO: 15 (ref 9–23)
BUN: 10 mg/dL (ref 6–24)
CO2: 27 mmol/L (ref 18–29)
Calcium: 9.9 mg/dL (ref 8.7–10.2)
Chloride: 99 mmol/L (ref 96–106)
Creatinine, Ser: 0.68 mg/dL (ref 0.57–1.00)
GFR calc Af Amer: 123 mL/min/{1.73_m2} (ref >59)
GFR calc non Af Amer: 107 mL/min/{1.73_m2} (ref >59)
Glucose: 95 mg/dL (ref 65–99)
Potassium: 4.3 mmol/L (ref 3.5–5.2)
SODIUM: 141 mmol/L (ref 134–144)

## 2016-06-11 ENCOUNTER — Other Ambulatory Visit: Payer: Self-pay

## 2016-06-11 ENCOUNTER — Ambulatory Visit (HOSPITAL_COMMUNITY): Payer: Commercial Managed Care - PPO | Attending: Cardiology

## 2016-06-11 DIAGNOSIS — I10 Essential (primary) hypertension: Secondary | ICD-10-CM | POA: Diagnosis not present

## 2016-06-11 DIAGNOSIS — R000 Tachycardia, unspecified: Secondary | ICD-10-CM | POA: Diagnosis not present

## 2016-06-11 DIAGNOSIS — R0789 Other chest pain: Secondary | ICD-10-CM | POA: Diagnosis not present

## 2016-06-11 DIAGNOSIS — R079 Chest pain, unspecified: Secondary | ICD-10-CM | POA: Diagnosis present

## 2016-06-11 DIAGNOSIS — Z8249 Family history of ischemic heart disease and other diseases of the circulatory system: Secondary | ICD-10-CM | POA: Insufficient documentation

## 2016-06-12 NOTE — Progress Notes (Signed)
Cardiology Office Note   Date:  06/14/2016   ID:  ANYLA Jackson, DOB [birth year], MRN [id number]  PCP:  Lowella Dandy, NP  Cardiologist:  Dr. Johnsie Cancel    Chief Complaint  Patient presents with  . Hypertension      History of Present Illness: Sally Jackson is a 46 y.o. female who presents for HTN.  Since lst visit her echo and stress test are normal.  We are still waiting on renal artery dopplers.  Today BP is  130/80.     She had recent TAH and BSO 13 weeks ago and has had HTN since.  Up to 171/103 and this required an ER visit.  She was hypokalemic and K+ was repleted the HCTZ was stopped and meds adjusted.    Today still on K+ will stop and recheck labs.  If drops may need further testing.  No chest pain, some headaches remain and some tachycardia but much better.    BP highest has been 865 systolic.  But mostly 130s.  No SOB.  Feeling more like herself.    Past Medical History:  Diagnosis Date  . ADD (attention deficit disorder)   . Anxiety   . Depression   . Endometriosis   . Head ache   . Hypertension   . Insomnia   . Malaise and fatigue   . Overanxious disorder   . Overweight     Past Surgical History:  Procedure Laterality Date  . ABDOMINAL HYSTERECTOMY    . BREAST SURGERY    . CESAREAN SECTION    . CHOLECYSTECTOMY    . DILATION AND CURETTAGE OF UTERUS    . OOPHORECTOMY    . PELVIC LAPAROSCOPY       Current Outpatient Prescriptions  Medication Sig Dispense Refill  . ALPRAZolam (XANAX) 0.25 MG tablet Take 0.25 mg by mouth every 8 (eight) hours as needed for anxiety.    Marland Kitchen dexmethylphenidate (FOCALIN XR) 20 MG 24 hr capsule Take 20 mg by mouth daily.    Marland Kitchen loratadine (CLARITIN) 10 MG tablet Take 10 mg by mouth daily as needed for allergies.    Marland Kitchen losartan (COZAAR) 25 MG tablet Take 1 tablet (25 mg total) by mouth daily. 90 tablet 3  . MELATONIN PO Take 1 tablet by mouth at bedtime as needed (sleep). Pt unsure of dosage    . metoprolol succinate (TOPROL XL) 25 MG 24  hr tablet Take 1 tablet (25 mg total) by mouth daily. 90 tablet 3  . Multiple Vitamins-Minerals (ONE DAILY COMPLETE PO) Take 1 tablet by mouth daily.     . potassium chloride (K-DUR) 10 MEQ tablet Take 2 tablets by mouth twice a day 90 tablet 0  . vortioxetine HBr (TRINTELLIX) 10 MG TABS Take 10 mg by mouth daily.     No current facility-administered medications for this visit.     Allergies:   Patient has no known allergies.    Social History:  The patient  reports that she has never smoked. She has never used smokeless tobacco. She reports that she does not drink alcohol or use drugs.   Family History:  The patient's family history includes Aortic stenosis in her son; Arrhythmia in her father; Cancer - Prostate in her father; Glaucoma in her maternal grandmother and mother; Healthy in her sister; Hyperlipidemia in her father and mother; Hypertension in her father and mother; Thyroid disease in her mother.    ROS:  General:no colds or fevers, no weight changes  Skin:no rashes or ulcers HEENT:no blurred vision, no congestion CV:see HPI PUL:see HPI GI:no diarrhea constipation or melena, no indigestion GU:no hematuria, no dysuria MS:no joint pain, no claudication Neuro:no syncope, no lightheadedness Endo:no diabetes, no thyroid disease  Wt Readings from Last 3 Encounters:  06/14/16 149 lb 12.8 oz (67.9 kg)  05/31/16 148 lb 1.9 oz (67.2 kg)     PHYSICAL EXAM: VS:  BP 130/80   Pulse 74   Ht $R'5\' 2"'bb$  (1.575 m)   Wt 149 lb 12.8 oz (67.9 kg)   BMI 27.40 kg/m  , BMI Body mass index is 27.4 kg/m. General:Pleasant affect, NAD Skin:Warm and dry, brisk capillary refill HEENT:normocephalic, sclera clear, mucus membranes moist Neck:supple, no JVD, no bruits  Heart:S1S2 RRR without murmur, gallup, rub or click Lungs:clear without rales, rhonchi, or wheezes PYK:DXIP, non tender, + BS, do not palpate liver spleen or masses Ext:no lower ext edema, 2+ pedal pulses, 2+ radial  pulses Neuro:alert and oriented, MAE, follows commands, + facial symmetry    EKG:  EKG is NOT ordered today.    Recent Labs: 05/24/2016: Hemoglobin 12.3; Platelets 275 05/31/2016: Magnesium 2.0 06/06/2016: BUN 10; Creatinine, Ser 0.68; Potassium 4.3; Sodium 141    Lipid Panel No results found for: CHOL, TRIG, HDL, CHOLHDL, VLDL, LDLCALC, LDLDIRECT     Other studies Reviewed: Additional studies/ records that were reviewed today include: .  Echo 06/11/16 Study Conclusions  - Left ventricle: The cavity size was normal. Systolic function was   normal. The estimated ejection fraction was in the range of 60%   to 65%. Wall motion was normal; there were no regional wall   motion abnormalities. Left ventricular diastolic function   parameters were normal. - Aortic valve: There was no regurgitation. - Mitral valve: There was no regurgitation. - Right ventricle: Systolic function was normal. - Tricuspid valve: There was trivial regurgitation. - Pulmonic valve: There was no regurgitation. - Pulmonary arteries: Systolic pressure was within the normal   range. - Inferior vena cava: The vessel was normal in size. - Pericardium, extracardiac: There was no pericardial effusion.  Impressions:  - Normal study.  ETT  $Re'5\' 2"'glY$  (1.575 m) 149 lb 12.8 oz (67.9 kg) 27.5  Study Highlights     Blood pressure demonstrated a normal response to exercise.  There was no ST segment deviation noted during stress.   1. Good exercise tolerance.  2. No ischemia by ECG analysis.       ASSESSMENT AND PLAN:  1.  Chest pain has resolved and reassuring ETT negative for ischemia  She will follow up with Dr. Johnsie Cancel in 4 months unless further problems.    2.  Uncontrolled HTN improved but elevated at times.  Will increase toprol to 50 mg, Echo was normal, reassuring.  3.  Tachycardia improved.  With increase of BB this should resolve but if not then event monitor.  4. Hypokalemia still on K+  supplement.  Will stop and recheck in 4 days.  If still abnormal will need further testing.   5.  S/p TH     Current medicines are reviewed with the patient today.  The patient Has no concerns regarding medicines.  The following changes have been made:  See above Labs/ tests ordered today include:see above  Disposition:   FU:  see above  Signed, Cecilie Kicks, NP  06/14/2016 8:10 AM    [address], [place], [address] [place], [place]  Phone: [phone]; Fax: [phone]

## 2016-06-14 ENCOUNTER — Encounter: Payer: Self-pay | Admitting: Cardiology

## 2016-06-14 ENCOUNTER — Encounter: Payer: Self-pay | Admitting: *Deleted

## 2016-06-14 ENCOUNTER — Ambulatory Visit (INDEPENDENT_AMBULATORY_CARE_PROVIDER_SITE_OTHER): Payer: Commercial Managed Care - PPO | Admitting: Cardiology

## 2016-06-14 VITALS — BP 130/80 | HR 74 | Ht 62.0 in | Wt 149.8 lb

## 2016-06-14 DIAGNOSIS — E876 Hypokalemia: Secondary | ICD-10-CM

## 2016-06-14 DIAGNOSIS — R0789 Other chest pain: Secondary | ICD-10-CM

## 2016-06-14 DIAGNOSIS — R000 Tachycardia, unspecified: Secondary | ICD-10-CM

## 2016-06-14 DIAGNOSIS — I10 Essential (primary) hypertension: Secondary | ICD-10-CM

## 2016-06-14 MED ORDER — METOPROLOL SUCCINATE ER 50 MG PO TB24
50.0000 mg | ORAL_TABLET | Freq: Every day | ORAL | 5 refills | Status: DC
Start: 2016-06-14 — End: 2017-07-18

## 2016-06-14 NOTE — Patient Instructions (Addendum)
Medication Instructions:   START TAKING METORPOLOL 50 MG ONCE A DAY   STOP TAKING POTASSIUM  10 MEQ   If you need a refill on your cardiac medications before your next appointment, please call your pharmacy.  Labwork: RETURN FOR LAB WORK  BMET ON  Monday OR Tuesday    Testing/Procedures: NONE ORDERED  TODAY    Follow-Up: WITH DR Eden EmmsNISHAN IN 4 MONTHS    Any Other Special Instructions Will Be Listed Below (If Applicable).

## 2016-06-18 ENCOUNTER — Other Ambulatory Visit (INDEPENDENT_AMBULATORY_CARE_PROVIDER_SITE_OTHER): Payer: Commercial Managed Care - PPO

## 2016-06-18 DIAGNOSIS — E876 Hypokalemia: Secondary | ICD-10-CM

## 2016-06-19 ENCOUNTER — Telehealth: Payer: Self-pay | Admitting: Cardiology

## 2016-06-19 LAB — BASIC METABOLIC PANEL
BUN / CREAT RATIO: 14 (ref 9–23)
BUN: 8 mg/dL (ref 6–24)
CO2: 26 mmol/L (ref 20–29)
CREATININE: 0.57 mg/dL (ref 0.57–1.00)
Calcium: 10 mg/dL (ref 8.7–10.2)
Chloride: 101 mmol/L (ref 96–106)
GFR, EST AFRICAN AMERICAN: 130 mL/min/{1.73_m2} (ref >59)
GFR, EST NON AFRICAN AMERICAN: 113 mL/min/{1.73_m2} (ref >59)
Glucose: 80 mg/dL (ref 65–99)
POTASSIUM: 4.1 mmol/L (ref 3.5–5.2)
SODIUM: 143 mmol/L (ref 134–144)

## 2016-06-19 NOTE — Telephone Encounter (Signed)
New message  ° ° ° ° °Pt is returning your call for lab results  °

## 2016-06-19 NOTE — Telephone Encounter (Signed)
-----   Message from Leone BrandLaura R Ingold, NP sent at 06/19/2016  9:08 AM EDT ----- K+ is 4.1 nomral and this is off potassium so no need to take potassium tabs.

## 2016-06-25 DIAGNOSIS — I10 Essential (primary) hypertension: Secondary | ICD-10-CM | POA: Diagnosis not present

## 2016-06-29 ENCOUNTER — Inpatient Hospital Stay (HOSPITAL_COMMUNITY): Admission: RE | Admit: 2016-06-29 | Payer: Commercial Managed Care - PPO | Source: Ambulatory Visit

## 2016-07-17 ENCOUNTER — Ambulatory Visit (HOSPITAL_COMMUNITY)
Admission: RE | Admit: 2016-07-17 | Discharge: 2016-07-17 | Disposition: A | Discharge status: Home / Self Care | Payer: Commercial Managed Care - PPO | Source: Ambulatory Visit | Attending: Cardiovascular Disease | Admitting: Cardiovascular Disease

## 2016-07-17 DIAGNOSIS — I10 Essential (primary) hypertension: Secondary | ICD-10-CM

## 2016-08-23 DIAGNOSIS — I10 Essential (primary) hypertension: Secondary | ICD-10-CM | POA: Diagnosis not present

## 2016-08-27 ENCOUNTER — Other Ambulatory Visit: Payer: Self-pay | Admitting: Internal Medicine

## 2016-08-27 DIAGNOSIS — Z1231 Encounter for screening mammogram for malignant neoplasm of breast: Secondary | ICD-10-CM

## 2016-09-17 ENCOUNTER — Ambulatory Visit: Payer: Commercial Managed Care - PPO

## 2016-09-20 DIAGNOSIS — Z1389 Encounter for screening for other disorder: Secondary | ICD-10-CM | POA: Diagnosis not present

## 2016-09-20 DIAGNOSIS — I10 Essential (primary) hypertension: Secondary | ICD-10-CM | POA: Diagnosis not present

## 2016-10-01 ENCOUNTER — Ambulatory Visit: Payer: Commercial Managed Care - PPO

## 2016-10-11 NOTE — Progress Notes (Signed)
Cardiology Office Note   Date:  10/15/2016   ID:  NAYDELIN ZIEGLER, DOB 04/26/1970, MRN [id number]  PCP:  Hurshel Party, NP  Cardiologist:  Dr. Eden Emms    No chief complaint on file.     History of Present Illness: Sally Jackson is a 46 y.o. 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 ADD (attention deficit disorder)   . Anxiety   . Depression   . Endometriosis   . Head ache   . Hypertension   . Insomnia   . Malaise and fatigue   . Overanxious disorder   . Overweight     Past Surgical History:  Procedure Laterality Date  . ABDOMINAL HYSTERECTOMY    . BREAST SURGERY    . CESAREAN SECTION    . CHOLECYSTECTOMY    . DILATION AND CURETTAGE OF UTERUS    . OOPHORECTOMY    . PELVIC LAPAROSCOPY       Current Outpatient Prescriptions  Medication Sig Dispense Refill  . ALPRAZolam (XANAX) 0.25 MG tablet Take 0.25 mg by mouth every 8 (eight) hours as needed for anxiety.    Marland Kitchen dexmethylphenidate (FOCALIN XR) 20 MG 24 hr capsule Take 20 mg by mouth daily.    Marland Kitchen estradiol (ESTRACE) 1 MG tablet Take 2 mg by mouth daily.    Marland Kitchen loratadine (CLARITIN) 10 MG tablet Take 10 mg by mouth daily as needed for allergies.    Marland Kitchen losartan (COZAAR) 25 MG tablet Take 1 tablet (25 mg total) by mouth daily. 90 tablet 3  . MELATONIN PO Take 1 tablet by mouth at bedtime as needed (sleep). Pt unsure of dosage    . metoprolol succinate (TOPROL-XL) 50 MG 24 hr tablet Take 1 tablet (50 mg total) by mouth daily. 30 tablet 5  . Multiple Vitamins-Minerals (ONE DAILY COMPLETE PO) Take 1 tablet by mouth daily.     Marland Kitchen vortioxetine HBr (TRINTELLIX)  10 MG TABS Take 10 mg by mouth daily.     No current facility-administered medications for this visit.     Allergies:   Patient has no known allergies.    Social History:  The patient  reports that she has never smoked. She has never used smokeless tobacco. She reports that she does not drink alcohol or use drugs.   Family History:  The patient's family history includes Aortic stenosis in her son; Arrhythmia in her father; Cancer - Prostate in her father; Glaucoma in her maternal grandmother and mother; Healthy in her sister; Hyperlipidemia in her father and mother; Hypertension in her father and mother; Thyroid disease in her mother.    ROS:  General:no colds or fevers, no weight changes Skin:no rashes or ulcers HEENT:no blurred vision, no congestion CV:see HPI PUL:see HPI GI:no diarrhea constipation or melena, no indigestion GU:no hematuria, no dysuria MS:no joint pain, no claudication Neuro:no syncope, no lightheadedness Endo:no diabetes, no thyroid disease  Wt Readings from Last 3 Encounters:  10/15/16 156 lb 4 oz (70.9 kg)  06/14/16 149 lb 12.8 oz (67.9 kg)  05/31/16 148 lb 1.9 oz (  67.2 kg)     PHYSICAL EXAM: VS:  BP 120/80   Ht 5' 2.5" (1.588 m)   Wt 156 lb 4 oz (70.9 kg)   BMI 28.12 kg/m  , BMI Body mass index is 28.12 kg/m. Affect appropriate Healthy:  appears stated age HEENT: normal Neck supple with no adenopathy JVP normal no bruits no thyromegaly Lungs clear with no wheezing and good diaphragmatic motion Heart:  S1/S2 no murmur, no rub, gallop or click PMI normal Abdomen: benighn, BS positve, no tenderness, no AAA no bruit.  No HSM or HJR Distal pulses intact with no bruits No edema Neuro non-focal Skin warm and dry No muscular weakness     EKG:  EKG is NOT ordered today.    Recent Labs: 05/24/2016: Hemoglobin 12.3; Platelets 275 05/31/2016: Magnesium 2.0 06/18/2016: BUN 8; Creatinine, Ser 0.57; Potassium 4.1; Sodium 143    Lipid Panel No  results found for: CHOL, TRIG, HDL, CHOLHDL, VLDL, LDLCALC, LDLDIRECT     Other studies Reviewed: Additional studies/ records that were reviewed today include: .  Echo 06/11/16 Study Conclusions  - Left ventricle: The cavity size was normal. Systolic function was   normal. The estimated ejection fraction was in the range of 60%   to 65%. Wall motion was normal; there were no regional wall   motion abnormalities. Left ventricular diastolic function   parameters were normal. - Aortic valve: There was no regurgitation. - Mitral valve: There was no regurgitation. - Right ventricle: Systolic function was normal. - Tricuspid valve: There was trivial regurgitation. - Pulmonic valve: There was no regurgitation. - Pulmonary arteries: Systolic pressure was within the normal   range. - Inferior vena cava: The vessel was normal in size. - Pericardium, extracardiac: There was no pericardial effusion.  Impressions:  - Normal study.  ETT   (1.575 m) 149 lb 12.8 oz (67.9 kg) 27.5  Study Highlights     Blood pressure demonstrated a normal response to exercise.  There was no ST segment deviation noted during stress.   1. Good exercise tolerance.  2. No ischemia by ECG analysis.       ASSESSMENT AND PLAN:  1.  Chest pain has resolved and reassuring ETT negative for ischemia  Observe   2.  HTN:  Beta blocker increased by PA 06/19/16 No RAS on duplex 07/17/16  Improved   3.  Tachycardia improved. With higher dose beta blocker    Charlton Haws

## 2016-10-15 ENCOUNTER — Encounter: Payer: Self-pay | Admitting: Cardiovascular Disease

## 2016-10-15 ENCOUNTER — Ambulatory Visit (INDEPENDENT_AMBULATORY_CARE_PROVIDER_SITE_OTHER): Payer: Commercial Managed Care - PPO | Admitting: Cardiovascular Disease

## 2016-10-15 VITALS — BP 120/80 | Ht 62.5 in | Wt 156.2 lb

## 2016-10-15 DIAGNOSIS — I10 Essential (primary) hypertension: Secondary | ICD-10-CM | POA: Diagnosis not present

## 2016-10-15 NOTE — Patient Instructions (Signed)
Medication Instructions:  Your physician recommends that you continue on your current medications as directed. Please refer to the Current Medication list given to you today.  Labwork: NONE  Testing/Procedures: NONE  Follow-Up: Your physician wants you to follow-up as needed with  Dr. Nishan.    If you need a refill on your cardiac medications before your next appointment, please call your pharmacy.    

## 2016-10-18 DIAGNOSIS — Z23 Encounter for immunization: Secondary | ICD-10-CM | POA: Diagnosis not present

## 2016-10-23 ENCOUNTER — Ambulatory Visit
Admission: RE | Admit: 2016-10-23 | Discharge: 2016-10-23 | Disposition: A | Discharge status: Home / Self Care | Payer: Commercial Managed Care - PPO | Source: Ambulatory Visit | Attending: Internal Medicine | Admitting: Internal Medicine

## 2016-10-23 DIAGNOSIS — Z1231 Encounter for screening mammogram for malignant neoplasm of breast: Secondary | ICD-10-CM | POA: Diagnosis not present

## 2016-11-15 DIAGNOSIS — I10 Essential (primary) hypertension: Secondary | ICD-10-CM | POA: Diagnosis not present

## 2017-01-03 DIAGNOSIS — J309 Allergic rhinitis, unspecified: Secondary | ICD-10-CM | POA: Diagnosis not present

## 2017-01-03 DIAGNOSIS — J069 Acute upper respiratory infection, unspecified: Secondary | ICD-10-CM | POA: Diagnosis not present

## 2017-02-18 DIAGNOSIS — I10 Essential (primary) hypertension: Secondary | ICD-10-CM | POA: Diagnosis not present

## 2017-03-19 DIAGNOSIS — Z0000 Encounter for general adult medical examination without abnormal findings: Secondary | ICD-10-CM | POA: Diagnosis not present

## 2017-04-18 DIAGNOSIS — L301 Dyshidrosis [pompholyx]: Secondary | ICD-10-CM | POA: Diagnosis not present

## 2017-05-16 DIAGNOSIS — L301 Dyshidrosis [pompholyx]: Secondary | ICD-10-CM | POA: Diagnosis not present

## 2017-06-20 DIAGNOSIS — I10 Essential (primary) hypertension: Secondary | ICD-10-CM | POA: Diagnosis not present

## 2017-07-18 ENCOUNTER — Other Ambulatory Visit: Payer: Self-pay | Admitting: Cardiovascular Disease

## 2017-07-18 MED ORDER — METOPROLOL SUCCINATE ER 50 MG PO TB24: 50.0000 mg | ORAL_TABLET | Freq: Every day | ORAL | 2 refills | Status: DC

## 2017-08-19 DIAGNOSIS — I10 Essential (primary) hypertension: Secondary | ICD-10-CM | POA: Diagnosis not present

## 2017-09-19 DIAGNOSIS — I10 Essential (primary) hypertension: Secondary | ICD-10-CM | POA: Diagnosis not present

## 2017-10-18 DIAGNOSIS — Z23 Encounter for immunization: Secondary | ICD-10-CM | POA: Diagnosis not present

## 2017-10-18 DIAGNOSIS — I10 Essential (primary) hypertension: Secondary | ICD-10-CM | POA: Diagnosis not present

## 2017-10-22 ENCOUNTER — Other Ambulatory Visit: Payer: Self-pay | Admitting: Cardiovascular Disease

## 2017-11-01 ENCOUNTER — Other Ambulatory Visit: Payer: Self-pay | Admitting: Internal Medicine

## 2017-11-01 DIAGNOSIS — Z1231 Encounter for screening mammogram for malignant neoplasm of breast: Secondary | ICD-10-CM

## 2017-11-21 DIAGNOSIS — I10 Essential (primary) hypertension: Secondary | ICD-10-CM | POA: Diagnosis not present

## 2017-12-13 ENCOUNTER — Ambulatory Visit: Payer: Commercial Managed Care - PPO

## 2018-01-16 DIAGNOSIS — J069 Acute upper respiratory infection, unspecified: Secondary | ICD-10-CM | POA: Diagnosis not present

## 2018-01-23 ENCOUNTER — Ambulatory Visit
Admission: RE | Admit: 2018-01-23 | Discharge: 2018-01-23 | Disposition: A | Discharge status: Home / Self Care | Payer: Commercial Managed Care - PPO | Source: Ambulatory Visit | Attending: Internal Medicine | Admitting: Internal Medicine

## 2018-01-23 DIAGNOSIS — Z1231 Encounter for screening mammogram for malignant neoplasm of breast: Secondary | ICD-10-CM

## 2018-03-24 DIAGNOSIS — Z1211 Encounter for screening for malignant neoplasm of colon: Secondary | ICD-10-CM | POA: Diagnosis not present

## 2018-03-24 DIAGNOSIS — Z0000 Encounter for general adult medical examination without abnormal findings: Secondary | ICD-10-CM | POA: Diagnosis not present

## 2018-03-24 DIAGNOSIS — Z6828 Body mass index (BMI) 28.0-28.9, adult: Secondary | ICD-10-CM | POA: Diagnosis not present

## 2019-04-03 ENCOUNTER — Other Ambulatory Visit: Payer: Self-pay | Admitting: Internal Medicine

## 2019-04-03 DIAGNOSIS — Z1231 Encounter for screening mammogram for malignant neoplasm of breast: Secondary | ICD-10-CM

## 2021-03-29 ENCOUNTER — Encounter (HOSPITAL_COMMUNITY): Payer: Self-pay | Admitting: *Deleted

## 2021-03-29 ENCOUNTER — Emergency Department (HOSPITAL_COMMUNITY)
Admission: EM | Admit: 2021-03-29 | Discharge: 2021-03-29 | Disposition: A | Discharge status: Home / Self Care | Payer: Commercial Managed Care - PPO | Attending: Emergency Medicine | Admitting: Emergency Medicine

## 2021-03-29 ENCOUNTER — Emergency Department (HOSPITAL_COMMUNITY): Payer: Commercial Managed Care - PPO

## 2021-03-29 ENCOUNTER — Other Ambulatory Visit: Payer: Self-pay

## 2021-03-29 DIAGNOSIS — R079 Chest pain, unspecified: Secondary | ICD-10-CM | POA: Insufficient documentation

## 2021-03-29 DIAGNOSIS — E876 Hypokalemia: Secondary | ICD-10-CM | POA: Diagnosis not present

## 2021-03-29 DIAGNOSIS — I10 Essential (primary) hypertension: Secondary | ICD-10-CM | POA: Insufficient documentation

## 2021-03-29 DIAGNOSIS — L03115 Cellulitis of right lower limb: Secondary | ICD-10-CM | POA: Diagnosis not present

## 2021-03-29 DIAGNOSIS — Z79899 Other long term (current) drug therapy: Secondary | ICD-10-CM | POA: Diagnosis not present

## 2021-03-29 DIAGNOSIS — R002 Palpitations: Secondary | ICD-10-CM | POA: Diagnosis not present

## 2021-03-29 DIAGNOSIS — Z20822 Contact with and (suspected) exposure to covid-19: Secondary | ICD-10-CM | POA: Insufficient documentation

## 2021-03-29 DIAGNOSIS — L03119 Cellulitis of unspecified part of limb: Secondary | ICD-10-CM

## 2021-03-29 LAB — CBC WITH DIFFERENTIAL/PLATELET
Abs Immature Granulocytes: 0.03 10*3/uL (ref 0.00–0.07)
Basophils Absolute: 0.1 10*3/uL (ref 0.0–0.1)
Basophils Relative: 1 %
Eosinophils Absolute: 0 10*3/uL (ref 0.0–0.5)
Eosinophils Relative: 0 %
HCT: 38.3 % (ref 36.0–46.0)
Hemoglobin: 12.7 g/dL (ref 12.0–15.0)
Immature Granulocytes: 0 %
Lymphocytes Relative: 12 %
Lymphs Abs: 1.1 10*3/uL (ref 0.7–4.0)
MCH: 30.3 pg (ref 26.0–34.0)
MCHC: 33.2 g/dL (ref 30.0–36.0)
MCV: 91.4 fL (ref 80.0–100.0)
Monocytes Absolute: 0.7 10*3/uL (ref 0.1–1.0)
Monocytes Relative: 8 %
Neutro Abs: 7.2 10*3/uL (ref 1.7–7.7)
Neutrophils Relative %: 79 %
Platelets: 278 10*3/uL (ref 150–400)
RBC: 4.19 MIL/uL (ref 3.87–5.11)
RDW: 12.7 % (ref 11.5–15.5)
WBC: 9.1 10*3/uL (ref 4.0–10.5)
nRBC: 0 % (ref 0.0–0.2)

## 2021-03-29 LAB — BASIC METABOLIC PANEL
Anion gap: 9 (ref 5–15)
BUN: 6 mg/dL (ref 6–20)
CO2: 28 mmol/L (ref 22–32)
Calcium: 9.1 mg/dL (ref 8.9–10.3)
Chloride: 100 mmol/L (ref 98–111)
Creatinine, Ser: 0.65 mg/dL (ref 0.44–1.00)
GFR, Estimated: >60 mL/min (ref >60)
Glucose, Bld: 110 mg/dL — ABNORMAL HIGH (ref 70–99)
Potassium: 3.2 mmol/L — ABNORMAL LOW (ref 3.5–5.1)
Sodium: 137 mmol/L (ref 135–145)

## 2021-03-29 LAB — TROPONIN I (HIGH SENSITIVITY)
Troponin I (High Sensitivity): 6 ng/L (ref <18)
Troponin I (High Sensitivity): 7 ng/L (ref <18)

## 2021-03-29 LAB — URINALYSIS, ROUTINE W REFLEX MICROSCOPIC
Bilirubin Urine: NEGATIVE
Glucose, UA: NEGATIVE mg/dL
Ketones, ur: NEGATIVE mg/dL
Leukocytes,Ua: NEGATIVE
Nitrite: NEGATIVE
Protein, ur: NEGATIVE mg/dL
Specific Gravity, Urine: <1.005 — ABNORMAL LOW (ref 1.005–1.030)
pH: 6 (ref 5.0–8.0)

## 2021-03-29 LAB — URINALYSIS, MICROSCOPIC (REFLEX)

## 2021-03-29 LAB — HEPATIC FUNCTION PANEL
ALT: 20 U/L (ref 0–44)
AST: 21 U/L (ref 15–41)
Albumin: 3.6 g/dL (ref 3.5–5.0)
Alkaline Phosphatase: 70 U/L (ref 38–126)
Bilirubin, Direct: <0.1 mg/dL (ref 0.0–0.2)
Total Bilirubin: 0.4 mg/dL (ref 0.3–1.2)
Total Protein: 7.1 g/dL (ref 6.5–8.1)

## 2021-03-29 LAB — RESP PANEL BY RT-PCR (FLU A&B, COVID) ARPGX2
Influenza A by PCR: NEGATIVE
Influenza B by PCR: NEGATIVE
SARS Coronavirus 2 by RT PCR: NEGATIVE

## 2021-03-29 LAB — BRAIN NATRIURETIC PEPTIDE: B Natriuretic Peptide: 51.7 pg/mL (ref 0.0–100.0)

## 2021-03-29 LAB — TSH: TSH: 3.05 u[IU]/mL (ref 0.350–4.500)

## 2021-03-29 MED ORDER — CEPHALEXIN 250 MG PO CAPS
500.0000 mg | ORAL_CAPSULE | Freq: Once | ORAL | Status: AC
  Administered 2021-03-29: 500 mg via ORAL
  Filled 2021-03-29: qty 2

## 2021-03-29 MED ORDER — ACETAMINOPHEN 325 MG PO TABS
650.0000 mg | ORAL_TABLET | Freq: Once | ORAL | Status: AC
  Administered 2021-03-29: 650 mg via ORAL
  Filled 2021-03-29: qty 2

## 2021-03-29 MED ORDER — CEPHALEXIN 500 MG PO CAPS: 500.0000 mg | ORAL_CAPSULE | Freq: Four times a day (QID) | ORAL | 0 refills | Status: DC

## 2021-03-29 MED ORDER — POTASSIUM CHLORIDE CRYS ER 20 MEQ PO TBCR
20.0000 meq | EXTENDED_RELEASE_TABLET | Freq: Once | ORAL | Status: AC
  Administered 2021-03-29: 20 meq via ORAL
  Filled 2021-03-29: qty 1

## 2021-03-29 NOTE — ED Triage Notes (Signed)
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

## 2021-03-29 NOTE — ED Provider Triage Note (Signed)
Emergency Medicine Provider Triage Evaluation Note ? ?Sally Jackson , a 51 y.o. female  was evaluated in triage.  Pt complains of chest pain and leg swelling. She noticed lower legs swelling bilaterally since yesterday morning. She has also had intermittent chest pains. She had the first episode last night, but it went away on its own. She had another episode of chest pain today while working. Pain is in center of chest. Sometimes radiates to left axillary region. Feels like pressure. Nothing has made it better or worse. It is present now.  No other symptoms.  ? ?Review of Systems  ?Positive: Chest pain, shortness of breath, leg swelling ?Negative:  ? ?Physical Exam  ?Ht 5\' 2"  (1.575 m)   Wt 70.9 kg   BMI 28.59 kg/m?  ?Gen:   Awake, no distress   ?Resp:  Normal effort  ?MSK:   Moves extremities without difficulty  ?Other:  Bilateral legs symmetric. Not painful. Nonpitting edema. ? ?Medical Decision Making  ?Medically screening exam initiated at 4:17 PM.  Appropriate orders placed.  JOEE MIZENKO was informed that the remainder of the evaluation will be completed by another provider, this initial triage assessment does not replace that evaluation, and the importance of remaining in the ED until their evaluation is complete. ? ?  ?Adolphus Birchwood, PA-C ?03/29/21 1620 ? ?

## 2021-03-29 NOTE — Discharge Instructions (Addendum)
You were seen in the ER today for the palpitations you have been experiencing.  Your physical exam, EKG, and blood work were very reassuring.  Please follow-up with the cardiologist listed below. ?In regards to the your right ankle swelling and redness, you have an skin infection.  You have been administered a dose of antibiotics in the emergency department and had been prescribed antibiotics which take daily for the next 5 days as prescribed.  You may use Tylenol or ibuprofen as needed for any discomfort.  You may use compression stockings on your legs at other times during work days to prevent temporary swelling in your legs from standing. ?Please follow close with your primary care doctor and return to the ER with any severe symptoms. ? ?Additionally pressure today found old fracture of your ankle.  No emergent intervention warranted as you were walking normally. ?

## 2021-03-29 NOTE — ED Provider Notes (Signed)
?[facility] ?Provider Note ? ? ?CSN: [id number] ?Arrival date & time: 03/29/21  1550 ? ?  ? ?History ? ?Chief Complaint  ?Patient presents with  ? Chest Pain  ? ? ?Sally Jackson is a 51 y.o. female who presents with concern for body aches, fatigue, bilateral lower extremity swelling for the last 2 days worse on the right than the left.  Patient is very anxious.  Strong family history of cardiac disorder. ? 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  She has had some redness, swelling, and pain to the inside of the right ankle which occurred a few days after she shaved her legs.  No fevers at home but some chills.  She also endorses some palpitations. ? ?I personally reviewed this patient's medical records.  She is history of hypertension, depression, endometriosis status post hysterectomy, on multiple behavioral health medications. ? ?HPI ? ?  ? ?Home Medications ?Prior to Admission medications   ?Medication Sig Start Date End Date Taking? Authorizing Provider  ?cephALEXin (KEFLEX) 500 MG capsule Take 1 capsule (500 mg total) by mouth 4 (four) times daily. 03/29/21  Yes Rhilee Currin, Gypsy Balsam, PA-C  ?ALPRAZolam (XANAX) 0.25 MG tablet Take 0.25 mg by mouth every 8 (eight) hours as needed for anxiety.    [provider]  ?dexmethylphenidate (FOCALIN XR) 20 MG 24 hr capsule Take 20 mg by mouth daily.    [provider]  ?estradiol (ESTRACE) 1 MG tablet Take 2 mg by mouth daily. 08/21/16   [provider]  ?loratadine (CLARITIN) 10 MG tablet Take 10 mg by mouth daily as needed for allergies.    [provider]  ?losartan (COZAAR) 25 MG tablet Take 1 tablet (25 mg total) by mouth daily. 05/31/16 10/15/16  Isaiah Serge, NP  ?MELATONIN PO Take 1 tablet by mouth at bedtime as needed (sleep). Pt unsure of dosage    [provider]  ?metoprolol succinate (TOPROL-XL) 50 MG 24 hr tablet TAKE 1 TABLET BY MOUTH DAILY 10/22/17   Josue Hector, MD  ?Multiple Vitamins-Minerals (ONE DAILY COMPLETE PO) Take 1 tablet by mouth daily.     [provider]  ?vortioxetine HBr (TRINTELLIX) 10 MG TABS Take 10 mg by mouth daily.    [provider]  ?   ? ?Allergies    ?Patient has no known allergies.   ? ?Review of Systems   ?Review of Systems  ?Constitutional:  Positive for chills, fatigue and fever. Negative for activity change and appetite change.  ?HENT: Negative.    ?Eyes: Negative.   ?Respiratory: Negative.    ?Cardiovascular:  Positive for palpitations and leg swelling. Negative for chest pain.  ?Gastrointestinal: Negative.   ?Genitourinary: Negative.   ?Musculoskeletal: Negative.   ?Skin:  Positive for color change and rash. Negative for wound.  ?Neurological:  Positive for headaches. Negative for light-headedness.  ? ?Physical Exam ?Updated Vital Signs ?BP 124/60   Pulse 85   Resp (!) 24   Ht 5\' 2"  (1.575 m)   Wt 70.9 kg   SpO2 100%   BMI 28.59 kg/m?  ?Physical Exam ?Vitals and nursing note reviewed.  ?Constitutional:   ?   Appearance: She is obese. She is not toxic-appearing.  ?HENT:  ?   Head: Normocephalic and atraumatic.  ?   Nose: Nose normal.  ?  Mouth/Throat:  ?   Mouth: Mucous membranes are moist.  ?   Pharynx: Oropharynx is clear. Uvula midline. No oropharyngeal exudate or posterior oropharyngeal erythema.  ?   Tonsils: No tonsillar exudate.  ?Eyes:  ?   General: Lids are normal. Vision grossly intact.     ?   Right eye: No discharge.     ?   Left eye: No discharge.  ?   Extraocular Movements: Extraocular movements intact.  ?   Conjunctiva/sclera: Conjunctivae normal.  ?   Pupils: Pupils are equal, round, and reactive to light.  ?Neck:  ?   Trachea: Trachea and phonation normal.  ?Cardiovascular:  ?   Rate and Rhythm: Normal rate and regular rhythm.  ?   Pulses: Normal pulses.  ?   Heart sounds: Normal heart  sounds. No murmur heard. ?   Comments: No calf TTP ?Pulmonary:  ?   Effort: Pulmonary effort is normal. No respiratory distress.  ?   Breath sounds: Normal breath sounds. No wheezing or rales.  ?Chest:  ?   Chest wall: No mass, tenderness or edema.  ?Abdominal:  ?   General: Bowel sounds are normal. There is no distension.  ?   Palpations: Abdomen is soft.  ?   Tenderness: There is no abdominal tenderness.  ?Musculoskeletal:     ?   General: No deformity.  ?   Cervical back: Normal range of motion and neck supple.  ?   Right lower leg: No edema.  ?   Left lower leg: No edema.  ?     Legs: ? ?   Comments: Abrasions adjacent to area of erythema, reportedly from recent shaving.   ?Lymphadenopathy:  ?   Cervical: No cervical adenopathy.  ?Skin: ?   General: Skin is warm and dry.  ?   Capillary Refill: Capillary refill takes less than 2 seconds.  ?Neurological:  ?   General: No focal deficit present.  ?   Mental Status: She is alert and oriented to person, place, and time. Mental status is at baseline.  ?   GCS: GCS eye subscore is 4. GCS verbal subscore is 5. GCS motor subscore is 6.  ?   Gait: Gait is intact.  ?Psychiatric:     ?   Mood and Affect: Mood normal.  ? ? ?ED Results / Procedures / Treatments   ?Labs ?(all labs ordered are listed, but only abnormal results are displayed) ?Labs Reviewed  ?BASIC METABOLIC PANEL - Abnormal; Notable for the following components:  ?    Result Value  ? Potassium 3.2 (*)   ? Glucose, Bld 110 (*)   ? All other components within normal limits  ?URINALYSIS, ROUTINE W REFLEX MICROSCOPIC - Abnormal; Notable for the following components:  ? Specific Gravity, Urine <1.005 (*)   ? Hgb urine dipstick TRACE (*)   ? All other components within normal limits  ?URINALYSIS, MICROSCOPIC (REFLEX) - Abnormal; Notable for the following components:  ? Bacteria, UA FEW (*)   ? All other components within normal limits  ?RESP PANEL BY RT-PCR (FLU A&B, COVID) ARPGX2  ?CBC WITH DIFFERENTIAL/PLATELET   ?BRAIN NATRIURETIC PEPTIDE  ?HEPATIC FUNCTION PANEL  ?TSH  ?TROPONIN I (HIGH SENSITIVITY)  ?TROPONIN I (HIGH SENSITIVITY)  ? ? ?EKG ?None ? ?Radiology ?DG Chest 2 View ? ?Result Date: 03/29/2021 ?CLINICAL DATA:  Shortness of breath. EXAM: CHEST - 2 VIEW COMPARISON:  Chest radiograph May 24, 2016 FINDINGS: The heart size and mediastinal contours are within  normal limits. No focal consolidation. No pleural effusion. No pneumothorax. Thoracic spondylosis. IMPRESSION: No acute cardiopulmonary process. Electronically Signed   By: Dahlia Bailiff M.D.   On: 03/29/2021 16:59  ? ?DG Ankle Right Port ? ?Result Date: 03/29/2021 ?CLINICAL DATA:  Pain and swelling. EXAM: PORTABLE RIGHT ANKLE - 2 VIEW COMPARISON:  None. FINDINGS: The ankle mortise is maintained. Remote ununited medial malleolus fracture. No acute bony findings or osteochondral lesion. No definite ankle joint effusion. The mid and hindfoot bony structures are intact. Calcaneal spurring changes are noted. IMPRESSION: 1. Remote ununited medial malleolus fracture. 2. No acute bony findings. Electronically Signed   By: Marijo Sanes M.D.   On: 03/29/2021 21:26   ? ?Procedures ?Procedures  ? ? ?Medications Ordered in ED ?Medications  ?acetaminophen (TYLENOL) tablet 650 mg (650 mg Oral Given 03/29/21 2118)  ?cephALEXin (KEFLEX) capsule 500 mg (500 mg Oral Given 03/29/21 2118)  ?potassium chloride SA (KLOR-CON M) CR tablet 20 mEq (20 mEq Oral Given 03/29/21 2122)  ? ? ?ED Course/ Medical Decision Making/ A&P ?  ?                        ?Medical Decision Making ?51 year old patient with bilateral ankle swelling, right greater than left with associated palpitations and anxiety. ? ?Hypertensive on intake, vital signs otherwise normal.  Cardiopulmonary exam, abdominal exam is benign.  Skin exam as above.  No calf tenderness to palpation or abnormal vascular status in the legs.  ? ?Amount and/or Complexity of Data Reviewed ?Labs: ordered. ?   Details: CBC without leukocytosis or  anemia.  BMP and hepatic function panel without significant metabolic derangement though mild hypokalemia of 3.2. ? ?Troponin is normal x2.  Chest x-ray negative for acute cardiopulmonary disease.  EKG with normal

## 2022-05-23 ENCOUNTER — Other Ambulatory Visit: Payer: Self-pay | Admitting: Internal Medicine

## 2022-05-23 DIAGNOSIS — Z1231 Encounter for screening mammogram for malignant neoplasm of breast: Secondary | ICD-10-CM

## 2022-06-18 ENCOUNTER — Ambulatory Visit
Admission: RE | Admit: 2022-06-18 | Discharge: 2022-06-18 | Disposition: A | Discharge status: Home / Self Care | Payer: Commercial Managed Care - PPO | Source: Ambulatory Visit | Attending: Internal Medicine | Admitting: Internal Medicine

## 2022-06-18 DIAGNOSIS — Z1231 Encounter for screening mammogram for malignant neoplasm of breast: Secondary | ICD-10-CM

## 2022-06-25 ENCOUNTER — Encounter: Payer: Self-pay | Admitting: Gastroenterology

## 2022-06-25 ENCOUNTER — Ambulatory Visit (AMBULATORY_SURGERY_CENTER): Payer: Commercial Managed Care - PPO

## 2022-06-25 VITALS — Ht 62.0 in | Wt 162.0 lb

## 2022-06-25 DIAGNOSIS — Z1211 Encounter for screening for malignant neoplasm of colon: Secondary | ICD-10-CM

## 2022-06-25 MED ORDER — NA SULFATE-K SULFATE-MG SULF 17.5-3.13-1.6 GM/177ML PO SOLN
1.0000 | Freq: Once | ORAL | 0 refills | Status: AC
Start: 2022-06-25 — End: 2022-06-25

## 2022-06-25 NOTE — Progress Notes (Signed)

## 2022-07-22 ENCOUNTER — Encounter: Payer: Self-pay | Admitting: Certified Registered Nurse Anesthetist

## 2022-07-23 ENCOUNTER — Encounter: Payer: Self-pay | Admitting: Gastroenterology

## 2022-07-23 ENCOUNTER — Ambulatory Visit: Payer: Commercial Managed Care - PPO | Admitting: Gastroenterology

## 2022-07-23 VITALS — BP 140/80 | HR 86 | Temp 98.0°F | Resp 22 | Ht 62.0 in | Wt 162.0 lb

## 2022-07-23 DIAGNOSIS — Z1211 Encounter for screening for malignant neoplasm of colon: Secondary | ICD-10-CM

## 2022-07-23 MED ORDER — SODIUM CHLORIDE 0.9 % IV SOLN
500.0000 mL | Freq: Once | INTRAVENOUS | Status: DC
Start: 2022-07-23 — End: 2022-07-23

## 2022-07-23 NOTE — Op Note (Signed)
[facility] Patient Name: Sally Jackson Procedure Date: 07/23/2022 10:01 AM MRN: [id number] Endoscopist: Lynann Bologna , MD, [id number] Age: 52 Referring MD:  Date of Birth: May 27, 1970 Gender: Female Account #: [id number] Procedure:                Colonoscopy Indications:              Screening for colorectal malignant neoplasm Medicines:                Monitored Anesthesia Care Procedure:                Pre-Anesthesia Assessment:                           - Prior to the procedure, a History and Physical                            was performed, and patient medications and                            allergies were reviewed. The patient's tolerance of                            previous anesthesia was also reviewed. The risks                            and benefits of the procedure and the sedation                            options and risks were discussed with the patient.                            All questions were answered, and informed consent                            was obtained. Prior Anticoagulants: The patient has                            taken no anticoagulant or antiplatelet agents. ASA                            Grade Assessment: I - A normal, healthy patient.                            After reviewing the risks and benefits, the patient                            was deemed in satisfactory condition to undergo the                            procedure.                           After obtaining informed consent, the colonoscope  was passed under direct vision. Throughout the                            procedure, the patient's blood pressure, pulse, and                            oxygen saturations were monitored continuously. The                            Olympus Scope SN: [id number] was introduced through                            the anus and advanced to the 2 cm into the ileum.                            The colonoscopy was performed  without difficulty.                            The patient tolerated the procedure well. The                            quality of the bowel preparation was good. The                            terminal ileum, ileocecal valve, appendiceal                            orifice, and rectum were photographed. Scope In: 10:10:10 AM Scope Out: 10:25:42 AM Scope Withdrawal Time: 0 hours 11 minutes 42 seconds  Total Procedure Duration: 0 hours 15 minutes 32 seconds  Findings:                 Rare early small-mouthed diverticula were found in                            the sigmoid colon.                           Non-bleeding internal hemorrhoids were found during                            retroflexion. The hemorrhoids were small and Grade                            I (internal hemorrhoids that do not prolapse).                           The terminal ileum appeared normal.                           The exam was otherwise without abnormality on                            direct and retroflexion views. The colon was  somewhat redundant. Complications:            No immediate complications. Estimated Blood Loss:     Estimated blood loss: none. Impression:               - Very minimal sigmoid diverticulosis.                           - Non-bleeding internal hemorrhoids.                           - The examined portion of the ileum was normal.                           - The examination was otherwise normal on direct                            and retroflexion views.                           - No specimens collected. Recommendation:           - Patient has a contact number available for                            emergencies. The signs and symptoms of potential                            delayed complications were discussed with the                            patient. Return to normal activities tomorrow.                            Written discharge instructions were  provided to the                            patient.                           - High fiber diet. Increase water intake                           - Continue present medications.                           - Repeat colonoscopy in 10 years for screening                            purposes. Earlier, if with any new problems or                            change in family history.                           - The findings and recommendations were discussed  with the patient's family. Lynann Bologna, MD 07/23/2022 10:30:03 AM This report has been signed electronically.

## 2022-07-23 NOTE — Progress Notes (Signed)
Pt's states no medical or surgical changes since previsit or office visit. 

## 2022-07-23 NOTE — Patient Instructions (Signed)

## 2022-07-23 NOTE — Progress Notes (Signed)
Gastroenterology History and Physical   Primary Care Physician:  Hurshel Party, NP   Reason for Procedure:   CRC screeing  Plan:    none     HPI: Sally Jackson is a 52 y.o. female    Past Medical History:  Diagnosis Date   ADD (attention deficit disorder)    Anxiety    Depression    Endometriosis    Head ache    Hypertension    Insomnia    Malaise and fatigue    Overanxious disorder    Overweight     Past Surgical History:  Procedure Laterality Date   ABDOMINAL HYSTERECTOMY     BREAST SURGERY     CESAREAN SECTION     CHOLECYSTECTOMY     DILATION AND CURETTAGE OF UTERUS     OOPHORECTOMY     PELVIC LAPAROSCOPY      Prior to Admission medications   Medication Sig Start Date End Date Taking? Authorizing Provider  amphetamine-dextroamphetamine (ADDERALL XR) 30 MG 24 hr capsule Take 30 mg by mouth daily. 07/17/22  Yes [provider]  busPIRone (BUSPAR) 5 MG tablet Take 5 mg by mouth 3 (three) times daily. 06/21/22  Yes [provider]  estradiol (ESTRACE) 1 MG tablet Take 2 mg by mouth daily. 08/21/16  Yes [provider]  loratadine (CLARITIN) 10 MG tablet Take 10 mg by mouth daily as needed for allergies.   Yes [provider]  losartan (COZAAR) 25 MG tablet Take 1 tablet (25 mg total) by mouth daily. 05/31/16 07/23/22 Yes Leone Brand, NP  metoprolol succinate (TOPROL-XL) 50 MG 24 hr tablet TAKE 1 TABLET BY MOUTH DAILY 10/22/17  Yes Wendall Stade, MD  Multiple Vitamins-Minerals (ONE DAILY COMPLETE PO) Take 1 tablet by mouth daily.    Yes [provider]  topiramate (TOPAMAX) 50 MG tablet Take 50 mg by mouth 2 (two) times daily.   Yes [provider]  Vilazodone HCl (VIIBRYD) 40 MG TABS Take 40 mg by mouth every morning. 07/09/22  Yes [provider]  ALPRAZolam (XANAX) 0.25 MG tablet Take 0.25 mg by mouth every 8 (eight) hours as needed for anxiety.    [provider]  dexmethylphenidate  (FOCALIN XR) 20 MG 24 hr capsule Take 20 mg by mouth daily.    [provider]  MELATONIN PO Take 1 tablet by mouth at bedtime as needed (sleep). Pt unsure of dosage Patient not taking: Reported on 06/25/2022    [provider]    Current Outpatient Medications  Medication Sig Dispense Refill   amphetamine-dextroamphetamine (ADDERALL XR) 30 MG 24 hr capsule Take 30 mg by mouth daily.     busPIRone (BUSPAR) 5 MG tablet Take 5 mg by mouth 3 (three) times daily.     estradiol (ESTRACE) 1 MG tablet Take 2 mg by mouth daily.     loratadine (CLARITIN) 10 MG tablet Take 10 mg by mouth daily as needed for allergies.     losartan (COZAAR) 25 MG tablet Take 1 tablet (25 mg total) by mouth daily. 90 tablet 3   metoprolol succinate (TOPROL-XL) 50 MG 24 hr tablet TAKE 1 TABLET BY MOUTH DAILY 30 tablet 0   Multiple Vitamins-Minerals (ONE DAILY COMPLETE PO) Take 1 tablet by mouth daily.      topiramate (TOPAMAX) 50 MG tablet Take 50 mg by mouth 2 (two) times daily.     Vilazodone HCl (VIIBRYD) 40 MG TABS Take 40 mg by mouth  every morning.     ALPRAZolam (XANAX) 0.25 MG tablet Take 0.25 mg by mouth every 8 (eight) hours as needed for anxiety.     dexmethylphenidate (FOCALIN XR) 20 MG 24 hr capsule Take 20 mg by mouth daily.     MELATONIN PO Take 1 tablet by mouth at bedtime as needed (sleep). Pt unsure of dosage (Patient not taking: Reported on 06/25/2022)     Current Facility-Administered Medications  Medication Dose Route Frequency Provider Last Rate Last Admin   0.9 %  sodium chloride infusion  500 mL Intravenous Once Lynann Bologna, MD        Allergies as of 07/23/2022   (No Known Allergies)    Family History  Problem Relation Age of Onset   Glaucoma Mother    Hyperlipidemia Mother    Hypertension Mother    Thyroid disease Mother    Hyperlipidemia Father    Hypertension Father    Cancer - Prostate Father    Arrhythmia Father    Healthy Sister    Glaucoma Maternal  Grandmother    Aortic stenosis Son    Colon cancer Neg Hx    Colon polyps Neg Hx    Esophageal cancer Neg Hx    Rectal cancer Neg Hx    Stomach cancer Neg Hx     Social History   Socioeconomic History   Marital status: Single    Spouse name: Not on file   Number of children: Not on file   Years of education: Not on file   Highest education level: Not on file  Occupational History   Not on file  Tobacco Use   Smoking status: Never   Smokeless tobacco: Never  Substance and Sexual Activity   Alcohol use: No   Drug use: No   Sexual activity: Not on file  Other Topics Concern   Not on file  Social History Narrative   Not on file   Social Determinants of Health   Financial Resource Strain: Not on file  Food Insecurity: Not on file  Transportation Needs: Not on file  Physical Activity: Not on file  Stress: Not on file  Social Connections: Not on file  Intimate Partner Violence: Not on file    Review of Systems: Positive for none All other review of systems negative except as mentioned in the HPI.  Physical Exam: Vital signs in last 24 hours: @VSRANGES @   General:   Alert,  Well-developed, well-nourished, pleasant and cooperative in NAD Lungs:  Clear throughout to auscultation.   Heart:  Regular rate and rhythm; no murmurs, clicks, rubs,  or gallops. Abdomen:  Soft, nontender and nondistended. Normal bowel sounds.   Neuro/Psych:  Alert and cooperative. Normal mood and affect. A and O x 3    No significant changes were identified.  The patient continues to be an appropriate candidate for the planned procedure and anesthesia.   Edman Circle, MD. [facility] 07/23/2022 9:52 AM@

## 2022-07-23 NOTE — Progress Notes (Signed)
Report given to PACU, vss 

## 2022-07-24 ENCOUNTER — Telehealth: Payer: Self-pay

## 2022-07-24 NOTE — Telephone Encounter (Signed)
  Follow up Call-     07/23/2022    9:41 AM  Call back number  Post procedure Call Back phone  # [phone]  Permission to leave phone message Yes     Patient questions:  Do you have a fever, pain , or abdominal swelling? No. Pain Score  0 *  Have you tolerated food without any problems? Yes.    Have you been able to return to your normal activities? Yes.    Do you have any questions about your discharge instructions: Diet   No. Medications  No. Follow up visit  No.  Do you have questions or concerns about your Care? No.  Actions: * If pain score is 4 or above: No action needed, pain <4.

## 2022-08-03 ENCOUNTER — Other Ambulatory Visit: Payer: Self-pay | Admitting: Internal Medicine

## 2022-08-03 DIAGNOSIS — N644 Mastodynia: Secondary | ICD-10-CM

## 2022-08-10 ENCOUNTER — Ambulatory Visit
Admission: RE | Admit: 2022-08-10 | Discharge: 2022-08-10 | Disposition: A | Discharge status: Home / Self Care | Payer: Commercial Managed Care - PPO | Source: Ambulatory Visit | Attending: Internal Medicine | Admitting: Internal Medicine

## 2022-08-10 ENCOUNTER — Ambulatory Visit: Admission: RE | Admit: 2022-08-10 | Payer: Commercial Managed Care - PPO | Source: Ambulatory Visit

## 2022-08-10 ENCOUNTER — Ambulatory Visit: Payer: Commercial Managed Care - PPO

## 2022-08-10 DIAGNOSIS — N644 Mastodynia: Secondary | ICD-10-CM

## 2022-08-20 ENCOUNTER — Other Ambulatory Visit: Payer: Commercial Managed Care - PPO

## 2023-12-09 IMAGING — CR DG CHEST 2V
2 series · 2 of 2 positions shown · non-contrast
Comparison: Chest radiograph May 24, 2016

CLINICAL DATA: Shortness of breath.

EXAM:
CHEST - 2 VIEW

[chest pa]
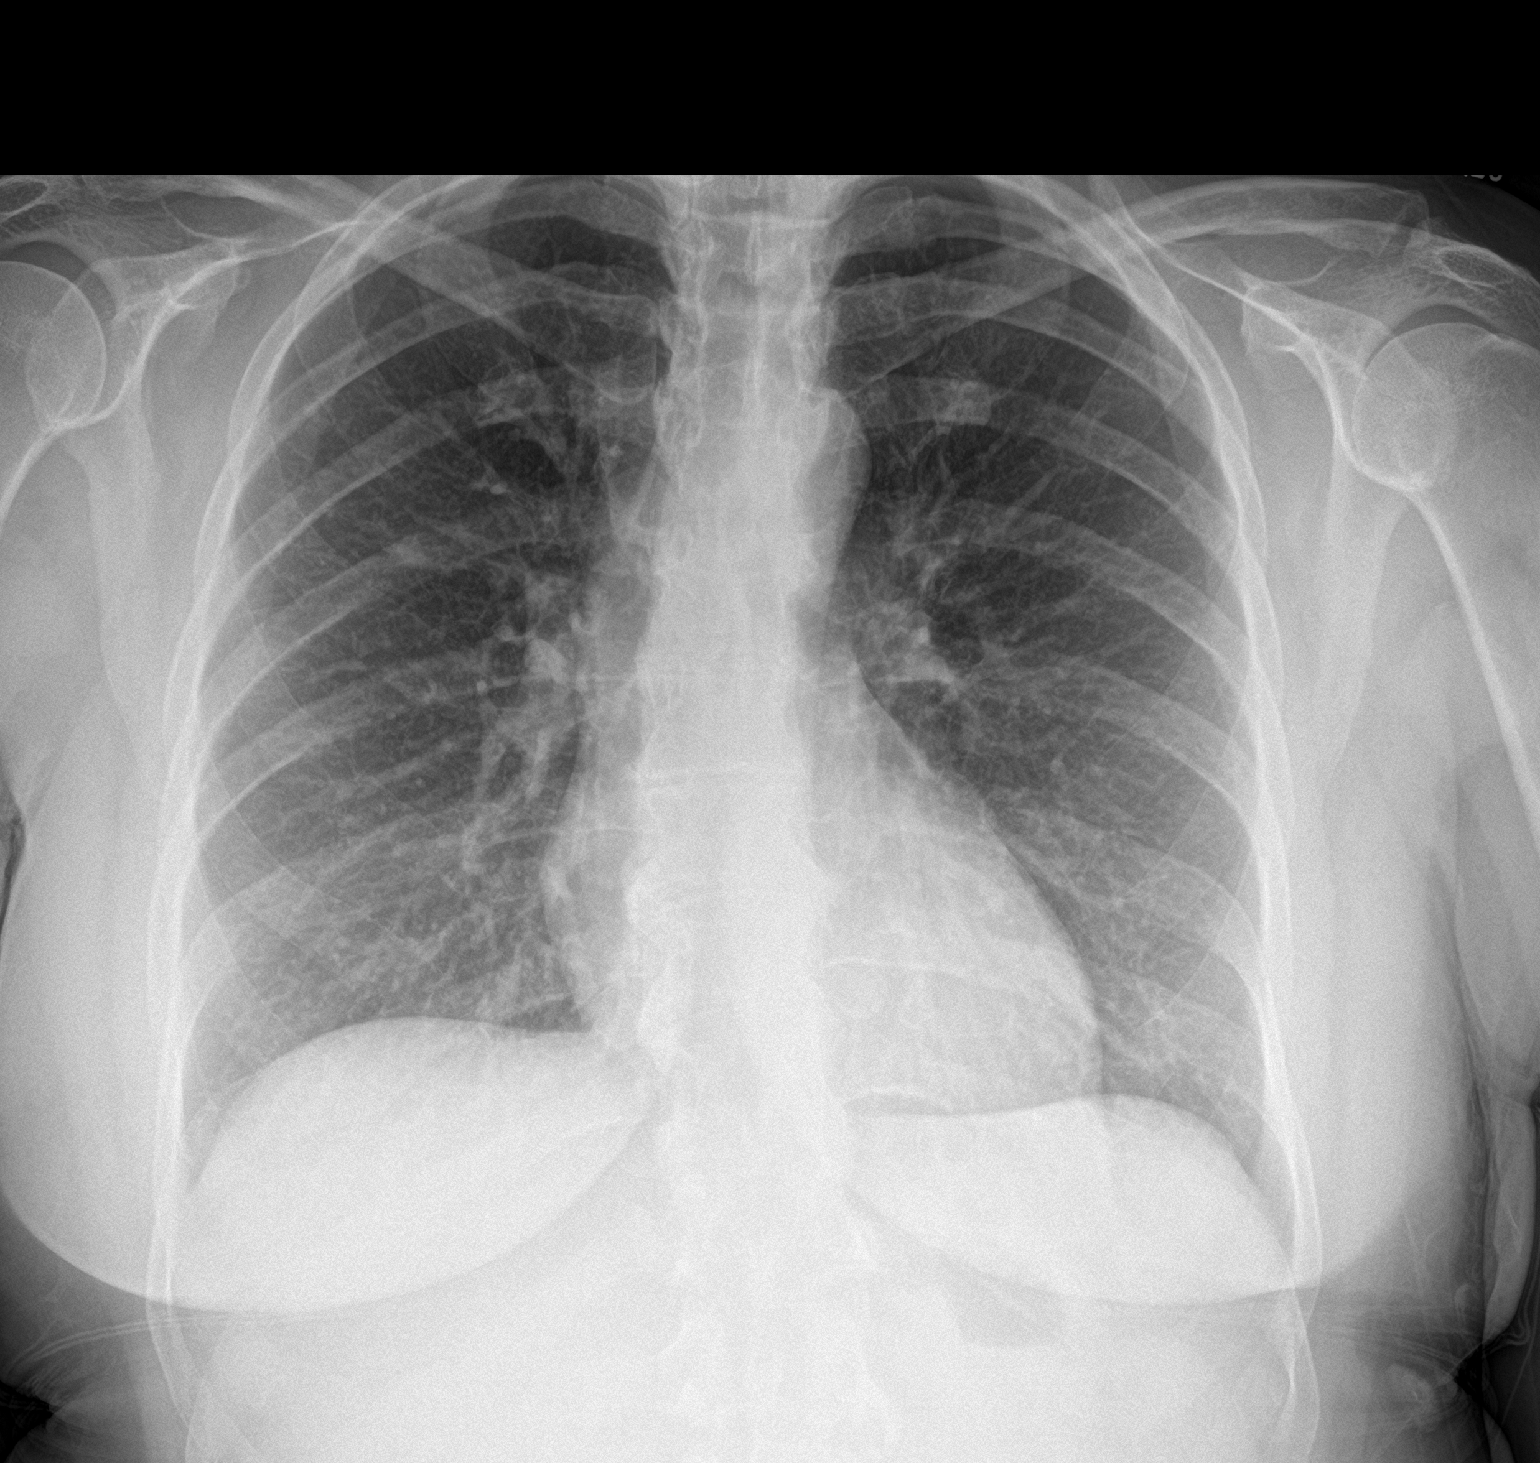

[chest lat]
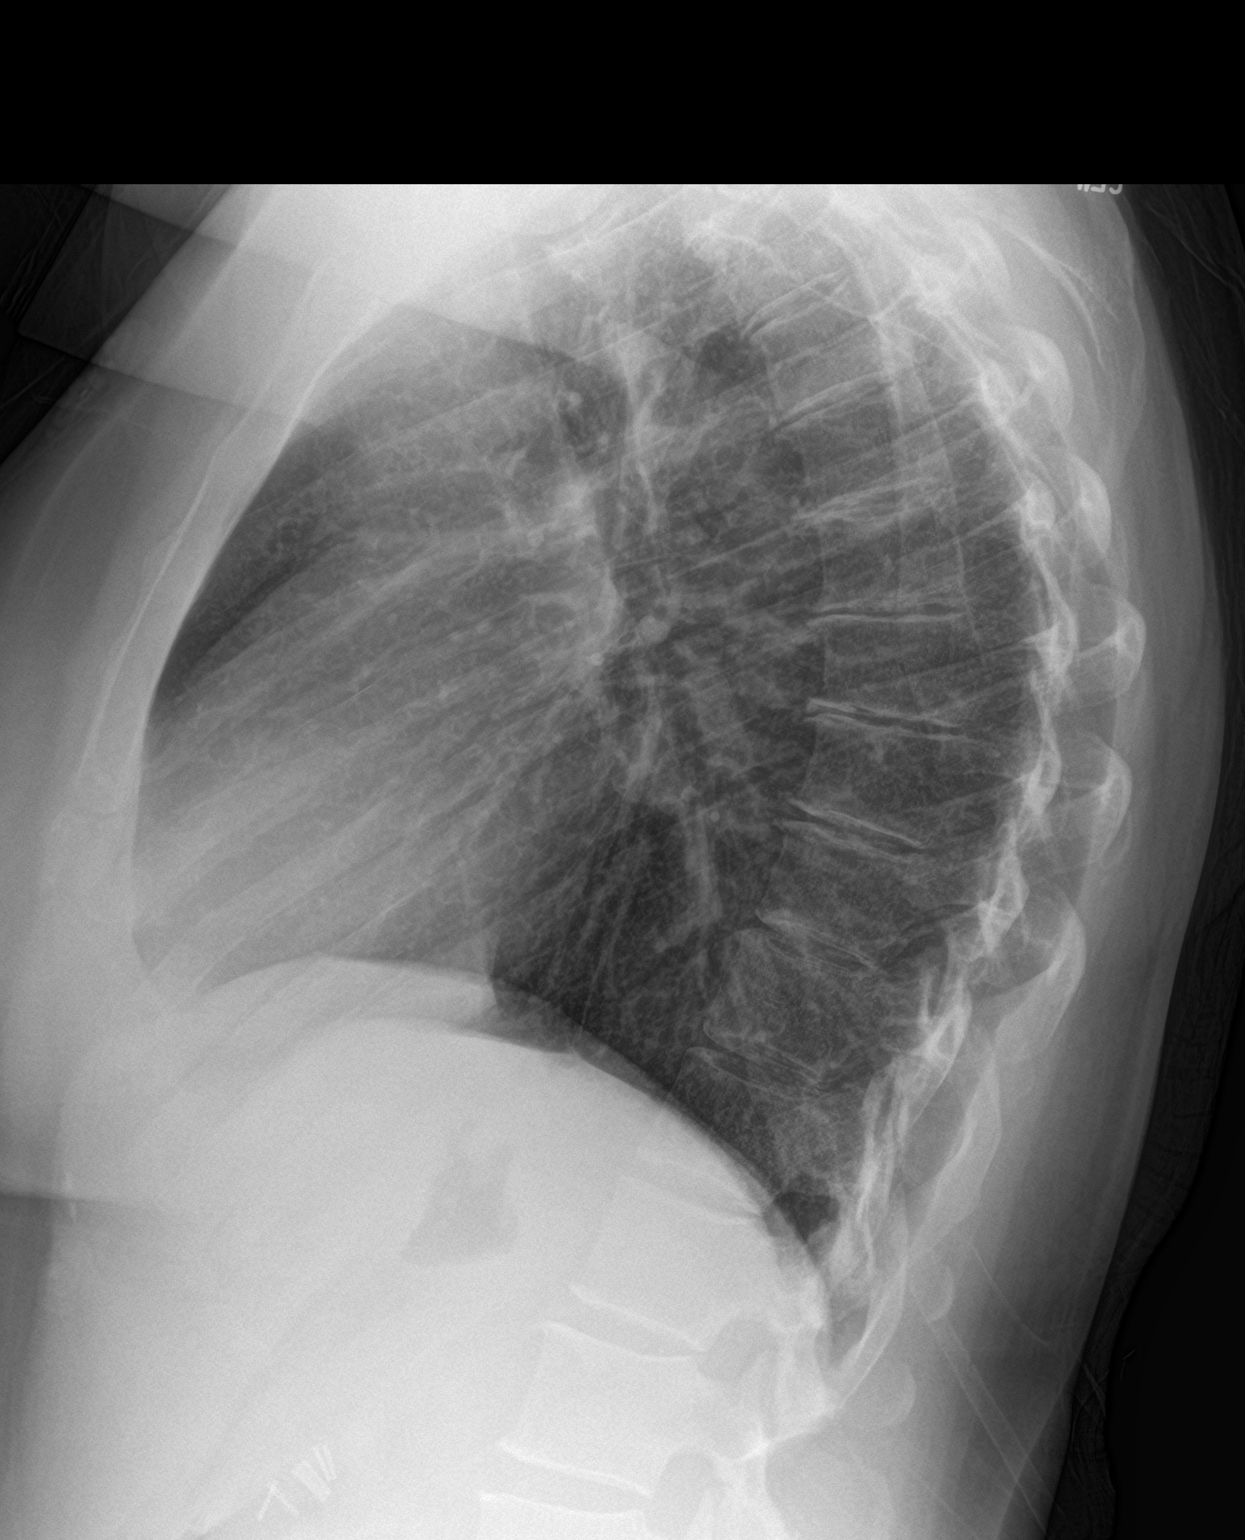

[2 of 2 positions shown; findings below may reference images not displayed]

FINDINGS: The heart size and mediastinal contours are within normal limits. No
focal consolidation. No pleural effusion. No pneumothorax. Thoracic
spondylosis.
IMPRESSION: No acute cardiopulmonary process.
